# Patient Record
Sex: Female | Born: 1945 | Hispanic: No | Marital: Married | Smoking: Never smoker
Health system: Southern US, Community
[De-identification: ages and names within clinical notes are randomized; demographics above are authoritative.]

## PROBLEM LIST (undated history)

## (undated) DIAGNOSIS — R591 Generalized enlarged lymph nodes: Secondary | ICD-10-CM

## (undated) DIAGNOSIS — R3129 Other microscopic hematuria: Secondary | ICD-10-CM

## (undated) DIAGNOSIS — C3490 Malignant neoplasm of unspecified part of unspecified bronchus or lung: Secondary | ICD-10-CM

## (undated) DIAGNOSIS — Z860100 Personal history of colon polyps, unspecified: Secondary | ICD-10-CM

## (undated) DIAGNOSIS — B019 Varicella without complication: Secondary | ICD-10-CM

## (undated) DIAGNOSIS — R7303 Prediabetes: Secondary | ICD-10-CM

## (undated) DIAGNOSIS — E039 Hypothyroidism, unspecified: Secondary | ICD-10-CM

## (undated) DIAGNOSIS — I10 Essential (primary) hypertension: Secondary | ICD-10-CM

## (undated) DIAGNOSIS — C50919 Malignant neoplasm of unspecified site of unspecified female breast: Secondary | ICD-10-CM

## (undated) DIAGNOSIS — B0223 Postherpetic polyneuropathy: Secondary | ICD-10-CM

## (undated) DIAGNOSIS — K219 Gastro-esophageal reflux disease without esophagitis: Secondary | ICD-10-CM

## (undated) DIAGNOSIS — Z87442 Personal history of urinary calculi: Secondary | ICD-10-CM

## (undated) DIAGNOSIS — M1990 Unspecified osteoarthritis, unspecified site: Secondary | ICD-10-CM

## (undated) DIAGNOSIS — E7800 Pure hypercholesterolemia, unspecified: Secondary | ICD-10-CM

## (undated) DIAGNOSIS — E119 Type 2 diabetes mellitus without complications: Secondary | ICD-10-CM

## (undated) DIAGNOSIS — Z87898 Personal history of other specified conditions: Secondary | ICD-10-CM

## (undated) DIAGNOSIS — D179 Benign lipomatous neoplasm, unspecified: Secondary | ICD-10-CM

## (undated) DIAGNOSIS — G4730 Sleep apnea, unspecified: Secondary | ICD-10-CM

## (undated) DIAGNOSIS — E079 Disorder of thyroid, unspecified: Secondary | ICD-10-CM

## (undated) DIAGNOSIS — C801 Malignant (primary) neoplasm, unspecified: Secondary | ICD-10-CM

## (undated) HISTORY — PX: COLONOSCOPY: SHX174

## (undated) HISTORY — PX: FRACTURE SURGERY: SHX138

## (undated) HISTORY — PX: EYE SURGERY: SHX253

## (undated) HISTORY — PX: THORACOSCOPY: SUR1347

## (undated) HISTORY — PX: CLOSED REDUCTION ANKLE FRACTURE: SUR210

## (undated) HISTORY — PX: LYMPHADENECTOMY: SHX15

## (undated) HISTORY — PX: OTHER SURGICAL HISTORY: SHX169

## (undated) HISTORY — PX: MASTECTOMY: SHX3

## (undated) HISTORY — PX: ESOPHAGOGASTRODUODENOSCOPY: SHX1529

## (undated) HISTORY — PX: TONSILLECTOMY: SUR1361

## (undated) HISTORY — PX: BUNIONECTOMY: SHX129

## (undated) HISTORY — DX: Malignant neoplasm of unspecified site of unspecified female breast: C50.919

## (undated) HISTORY — PX: ABDOMINAL HYSTERECTOMY: SHX81

---

## 1992-12-16 DIAGNOSIS — C801 Malignant (primary) neoplasm, unspecified: Secondary | ICD-10-CM

## 1992-12-16 HISTORY — DX: Malignant (primary) neoplasm, unspecified: C80.1

## 2001-04-02 ENCOUNTER — Other Ambulatory Visit: Admission: RE | Admit: 2001-04-02 | Discharge: 2001-04-02 | Payer: Self-pay | Admitting: Otolaryngology

## 2004-10-15 ENCOUNTER — Ambulatory Visit: Payer: Self-pay | Admitting: Gastroenterology

## 2005-02-12 ENCOUNTER — Emergency Department: Payer: Self-pay | Admitting: General Practice

## 2006-03-05 ENCOUNTER — Emergency Department: Payer: Self-pay | Admitting: Emergency Medicine

## 2007-07-22 ENCOUNTER — Ambulatory Visit: Payer: Self-pay | Admitting: Gastroenterology

## 2008-05-23 ENCOUNTER — Ambulatory Visit: Payer: Self-pay

## 2009-07-17 ENCOUNTER — Ambulatory Visit: Payer: Self-pay | Admitting: Internal Medicine

## 2009-11-21 ENCOUNTER — Ambulatory Visit: Payer: Self-pay | Admitting: Gastroenterology

## 2012-03-01 ENCOUNTER — Ambulatory Visit: Payer: Self-pay | Admitting: Internal Medicine

## 2012-09-02 ENCOUNTER — Ambulatory Visit: Payer: Self-pay | Admitting: Surgery

## 2012-09-02 LAB — CREATININE, SERUM: EGFR (African American): >60

## 2013-02-01 ENCOUNTER — Emergency Department: Payer: Self-pay | Admitting: Emergency Medicine

## 2013-02-16 ENCOUNTER — Ambulatory Visit: Payer: Self-pay | Admitting: Gastroenterology

## 2013-02-17 LAB — PATHOLOGY REPORT

## 2013-07-21 ENCOUNTER — Ambulatory Visit: Payer: Self-pay | Admitting: Family Medicine

## 2014-02-16 HISTORY — PX: BRONCHOSCOPY: SUR163

## 2014-05-11 DIAGNOSIS — C3492 Malignant neoplasm of unspecified part of left bronchus or lung: Secondary | ICD-10-CM | POA: Insufficient documentation

## 2014-05-11 DIAGNOSIS — I10 Essential (primary) hypertension: Secondary | ICD-10-CM | POA: Insufficient documentation

## 2014-05-11 DIAGNOSIS — E039 Hypothyroidism, unspecified: Secondary | ICD-10-CM | POA: Insufficient documentation

## 2014-05-11 DIAGNOSIS — K219 Gastro-esophageal reflux disease without esophagitis: Secondary | ICD-10-CM | POA: Insufficient documentation

## 2015-05-15 DIAGNOSIS — E7800 Pure hypercholesterolemia, unspecified: Secondary | ICD-10-CM | POA: Insufficient documentation

## 2015-06-21 ENCOUNTER — Ambulatory Visit: Payer: Medicaid Other | Attending: Neurology

## 2015-07-19 ENCOUNTER — Ambulatory Visit: Payer: Medicaid Other | Attending: Neurology

## 2015-07-19 DIAGNOSIS — G4733 Obstructive sleep apnea (adult) (pediatric): Secondary | ICD-10-CM | POA: Diagnosis not present

## 2015-07-19 DIAGNOSIS — R0683 Snoring: Secondary | ICD-10-CM | POA: Diagnosis present

## 2015-08-09 ENCOUNTER — Ambulatory Visit: Payer: Medicaid Other | Attending: Neurology

## 2015-08-09 DIAGNOSIS — I10 Essential (primary) hypertension: Secondary | ICD-10-CM | POA: Diagnosis present

## 2015-08-09 DIAGNOSIS — R0683 Snoring: Secondary | ICD-10-CM | POA: Diagnosis present

## 2015-08-09 DIAGNOSIS — G4733 Obstructive sleep apnea (adult) (pediatric): Secondary | ICD-10-CM | POA: Diagnosis not present

## 2015-08-09 DIAGNOSIS — G4761 Periodic limb movement disorder: Secondary | ICD-10-CM | POA: Insufficient documentation

## 2015-12-06 DIAGNOSIS — G4733 Obstructive sleep apnea (adult) (pediatric): Secondary | ICD-10-CM | POA: Insufficient documentation

## 2017-09-23 ENCOUNTER — Other Ambulatory Visit: Payer: Self-pay | Admitting: Student

## 2017-09-23 DIAGNOSIS — R1011 Right upper quadrant pain: Secondary | ICD-10-CM

## 2017-09-25 ENCOUNTER — Ambulatory Visit
Admission: RE | Admit: 2017-09-25 | Discharge: 2017-09-25 | Disposition: A | Discharge status: Home / Self Care | Payer: Medicare HMO | Source: Ambulatory Visit | Attending: Student | Admitting: Student

## 2017-09-25 DIAGNOSIS — K760 Fatty (change of) liver, not elsewhere classified: Secondary | ICD-10-CM | POA: Insufficient documentation

## 2017-09-25 DIAGNOSIS — R1011 Right upper quadrant pain: Secondary | ICD-10-CM

## 2017-09-27 ENCOUNTER — Other Ambulatory Visit: Payer: Self-pay

## 2017-09-27 ENCOUNTER — Emergency Department
Admission: EM | Admit: 2017-09-27 | Discharge: 2017-09-27 | Disposition: A | Discharge status: Home / Self Care | Payer: Medicare HMO | Attending: Emergency Medicine | Admitting: Emergency Medicine

## 2017-09-27 DIAGNOSIS — M792 Neuralgia and neuritis, unspecified: Secondary | ICD-10-CM | POA: Diagnosis not present

## 2017-09-27 DIAGNOSIS — Z91040 Latex allergy status: Secondary | ICD-10-CM | POA: Insufficient documentation

## 2017-09-27 DIAGNOSIS — R1013 Epigastric pain: Secondary | ICD-10-CM | POA: Diagnosis present

## 2017-09-27 DIAGNOSIS — Z853 Personal history of malignant neoplasm of breast: Secondary | ICD-10-CM | POA: Diagnosis not present

## 2017-09-27 DIAGNOSIS — B029 Zoster without complications: Secondary | ICD-10-CM | POA: Insufficient documentation

## 2017-09-27 DIAGNOSIS — K219 Gastro-esophageal reflux disease without esophagitis: Secondary | ICD-10-CM | POA: Insufficient documentation

## 2017-09-27 DIAGNOSIS — Z85118 Personal history of other malignant neoplasm of bronchus and lung: Secondary | ICD-10-CM | POA: Insufficient documentation

## 2017-09-27 DIAGNOSIS — I10 Essential (primary) hypertension: Secondary | ICD-10-CM | POA: Diagnosis not present

## 2017-09-27 LAB — COMPREHENSIVE METABOLIC PANEL
ALBUMIN: 4 g/dL (ref 3.5–5.0)
ALK PHOS: 64 U/L (ref 38–126)
ALT: 23 U/L (ref 14–54)
AST: 25 U/L (ref 15–41)
Anion gap: 10 (ref 5–15)
BUN: 16 mg/dL (ref 6–20)
CALCIUM: 9 mg/dL (ref 8.9–10.3)
CHLORIDE: 106 mmol/L (ref 101–111)
CO2: 24 mmol/L (ref 22–32)
CREATININE: 0.75 mg/dL (ref 0.44–1.00)
GFR calc Af Amer: >60 mL/min (ref >60)
GFR calc non Af Amer: >60 mL/min (ref >60)
GLUCOSE: 118 mg/dL — AB (ref 65–99)
Potassium: 3.3 mmol/L — ABNORMAL LOW (ref 3.5–5.1)
Sodium: 140 mmol/L (ref 135–145)
Total Bilirubin: 0.6 mg/dL (ref 0.3–1.2)
Total Protein: 6.9 g/dL (ref 6.5–8.1)

## 2017-09-27 LAB — TROPONIN I

## 2017-09-27 LAB — CBC
HCT: 40 % (ref 35.0–47.0)
Hemoglobin: 13.6 g/dL (ref 12.0–16.0)
MCH: 30.1 pg (ref 26.0–34.0)
MCHC: 33.9 g/dL (ref 32.0–36.0)
MCV: 88.8 fL (ref 80.0–100.0)
PLATELETS: 249 10*3/uL (ref 150–440)
RBC: 4.51 MIL/uL (ref 3.80–5.20)
RDW: 13.5 % (ref 11.5–14.5)
WBC: 4.5 10*3/uL (ref 3.6–11.0)

## 2017-09-27 LAB — LIPASE, BLOOD: LIPASE: 36 U/L (ref 11–51)

## 2017-09-27 MED ORDER — VALACYCLOVIR HCL 500 MG PO TABS
1000.0000 mg | ORAL_TABLET | Freq: Once | ORAL | Status: AC
  Administered 2017-09-27: 1000 mg via ORAL
  Filled 2017-09-27: qty 2

## 2017-09-27 MED ORDER — FAMOTIDINE 20 MG PO TABS: 20.0000 mg | ORAL_TABLET | Freq: Two times a day (BID) | ORAL | 0 refills | Status: DC

## 2017-09-27 MED ORDER — VALACYCLOVIR HCL 1 G PO TABS: 1000.0000 mg | ORAL_TABLET | Freq: Three times a day (TID) | ORAL | 0 refills | Status: AC

## 2017-09-27 MED ORDER — GABAPENTIN 300 MG PO CAPS
300.0000 mg | ORAL_CAPSULE | Freq: Once | ORAL | Status: AC
  Administered 2017-09-27: 300 mg via ORAL
  Filled 2017-09-27: qty 1

## 2017-09-27 MED ORDER — LIDOCAINE HCL 2 % EX GEL
1.0000 "application " | Freq: Once | CUTANEOUS | Status: AC
  Administered 2017-09-27: 07:00:00 via TOPICAL
  Filled 2017-09-27: qty 10

## 2017-09-27 MED ORDER — GABAPENTIN 300 MG PO CAPS: 300.0000 mg | ORAL_CAPSULE | Freq: Three times a day (TID) | ORAL | 0 refills | Status: DC | PRN

## 2017-09-27 NOTE — Discharge Instructions (Signed)
1.  Take Valtrex 1 g 3 times daily for 7 days. 2.  You may take Neurontin 3 times daily as needed for pain. 3.  You may apply Lidocaine gel to affected area twice daily as needed for pain. 4.  Start Pepcid 20 mg twice daily. 5.  Return to the ER for worsening symptoms, persistent vomiting, difficulty breathing or other concerns.

## 2017-09-27 NOTE — ED Triage Notes (Signed)
Patient with complaint of epigastric pain radiating under right breast times 5 days. Patient states that she had an Ultra Sound Friday that she was told did not show anything. Patient denies nausea or vomiting.

## 2017-09-27 NOTE — ED Notes (Addendum)
Pt reports intermittent epigastric pain that comes and goes from a "5 to an 8 or 9" in intensity x6 days. Pt also states the pain sometimes radiates into the middle right side of her back. Pt denies N/V/D, no SHOB, no fever, no c/o of lightheadedness or dizziness.

## 2017-09-27 NOTE — ED Provider Notes (Signed)
[facility] Emergency Department Provider Note   ____________________________________________   First MD Initiated Contact with Patient 09/27/17 [phone]     (approximate)  I have reviewed the triage vital signs and the nursing notes.   HISTORY  Chief Complaint Abdominal Pain    HPI Betty Monroe is a 72 y.o. female who presents to the ED from home with a chief complaint of epigastric and right upper quadrant pain.  Patient developed the pain 5 days ago after attending a Super Bowl party during which she ate chicken wings which she does not usually do.  She was seen by her doctor's PA several days ago with normal lab work including LFTs and lipase.  The following day she had a right upper quadrant ultrasound which was normal.  Describes pain is sharp and burning from her epigastrium along the bottom of her right breast into her right flank.  Denies associated fever, chills, chest pain, shortness of breath, nausea, vomiting, dysuria.  Took MiraLAX yesterday and had a bowel movement.  Has been eating bland foods since pain began.  Eating does not make the pain better or worse.   Past medical history . Arthritis  . Breast cancer , unspecified (CMS-HCC) 12/1992  LEFT BREAST-CHEMO/RADIATION 20 YEARS AGO  . Chickenpox  . GERD (gastroesophageal reflux disease)  . H/O angioedema  History of ACE induced angioedema.  . History of colonic polyps  Adenomatous.  Marland Kitchen History of kidney stones  . Hypertension  . Lung cancer (CMS-HCC) 2005, 2015  Wedge resection 2005, then recurernce 2015 with completion LLLobectomy. Follows with Dr. Laveda Norman for Oncology  . Lymphadenopathy  . Microscopic hematuria  With negative workup by Dr. Jacqlyn Larsen.  . Multiple lipomas  . Sleep apnea  DENTAL APPLIANCE ONLY  . Thyroid disease   There are no active problems to display for this patient.   Past surgical history . AXILLARY LYMPHADENECTOMY  . BRONCHOSCOPY N/A 02/16/2014  Procedure:  BRONCHOSCOPY; Surgeon: Marcelline Deist, MD; Location: [place]; Service: Cardiothoracic; Laterality: N/A;  . Bunionectomy x2  Bunionectomy in 1986 and 1987.  Marland Kitchen CLOSED REDUCTION ANKLE FRACTURE  . COLONOSCOPY  . Deviated septum repair 2005  . EGD  . MASTECTOMY PARTIAL / LUMPECTOMY Left  . THORACOSCOPY WITH LOBECTOMY LUNG Left 02/16/2014  Procedure: THORACOSCOPY W/LOBECTOMY LUNG; Surgeon: Marcelline Deist, MD; Location: [place]; Service: Cardiothoracic; Laterality: Left;  . TONSILLECTOMY  T&A done at time of uvuloectomy.  Marland Kitchen uvular extraction    Prior to Admission medications   Not on File    Allergies Lisinopril and Latex  No family history on file.  Social History Social History   Tobacco Use  . Smoking status: Not on file  Substance Use Topics  . Alcohol use: Not on file  . Drug use: Not on file  Nonsmoker  Review of Systems  Constitutional: No fever/chills. Eyes: No visual changes. ENT: No sore throat. Cardiovascular: Denies chest pain. Respiratory: Denies shortness of breath. Gastrointestinal: Positive for upper abdominal pain.  No nausea, no vomiting.  No diarrhea.  No constipation. Genitourinary: Negative for dysuria. Musculoskeletal: Negative for back pain. Skin: Negative for rash. Neurological: Negative for headaches, focal weakness or numbness.   ____________________________________________   PHYSICAL EXAM:  VITAL SIGNS: ED Triage Vitals  Enc Vitals Group     BP 09/27/17 0517 (!) 166/85     Pulse Rate 09/27/17 0517 74     Resp 09/27/17 0517 20     Temp 09/27/17 0517 97.8 F (36.6  C)     Temp Source 09/27/17 0517 Oral     SpO2 09/27/17 0517 99 %     Weight 09/27/17 0514 200 lb (90.7 kg)     Height 09/27/17 0514 5\' 6"  (1.676 m)     Head Circumference --      Peak Flow --      Pain Score 09/27/17 0513 6     Pain Loc --      Pain Edu? --      Excl. in [place]? --     Constitutional: Alert and oriented. Well appearing and in no acute  distress. Eyes: Conjunctivae are normal. PERRL. EOMI. Head: Atraumatic. Nose: No congestion/rhinnorhea. Mouth/Throat: Mucous membranes are moist.  Oropharynx non-erythematous. Neck: No stridor.   Cardiovascular: Normal rate, regular rhythm. Grossly normal heart sounds.  Good peripheral circulation. Respiratory: Normal respiratory effort.  No retractions. Lungs CTAB. Gastrointestinal: Soft and nontender to light or deep palpation. No distention. No abdominal bruits. No CVA tenderness. Musculoskeletal: No lower extremity tenderness nor edema.  No joint effusions. Neurologic:  Normal speech and language. No gross focal neurologic deficits are appreciated. No gait instability. Skin:  Skin is warm, dry and intact.  Patchy and erythematous vesicular rash noted under right breast wrapping around lateral chest wall and back consistent with shingles.   Psychiatric: Mood and affect are normal. Speech and behavior are normal.  ____________________________________________   LABS (all labs ordered are listed, but only abnormal results are displayed)  Labs Reviewed  CBC  LIPASE, BLOOD  COMPREHENSIVE METABOLIC PANEL  URINALYSIS, COMPLETE (UACMP) WITH MICROSCOPIC  TROPONIN I   ____________________________________________  EKG  ED ECG REPORT I, Cally Nygard J, the attending physician, personally viewed and interpreted this ECG.   Date: 09/27/2017  EKG Time: 0521  Rate: 69  Rhythm: normal EKG, normal sinus rhythm  Axis: Normal  Intervals:none  ST&T Change: Nonspecific  ____________________________________________  RADIOLOGY  ED MD interpretation:  None  Official radiology report(s): No results found.  ____________________________________________   PROCEDURES  Procedure(s) performed: None  Procedures  Critical Care performed: No  ____________________________________________   INITIAL IMPRESSION / ASSESSMENT AND PLAN / ED COURSE  As part of my medical decision making, I  reviewed the following data within the [place] notes reviewed and incorporated, Labs reviewed, EKG interpreted, Old chart reviewed and Notes from prior ED visits.   72 year old female who presents with a 5-day history of right upper quadrant abdominal pain; normal lab work and ultrasound by her PCP earlier this week. Differential diagnosis includes, but is not limited to, biliary disease (biliary colic, acute cholecystitis, cholangitis, choledocholithiasis, etc), intrathoracic causes for epigastric abdominal pain including ACS, gastritis, duodenitis, pancreatitis, small bowel or large bowel obstruction, abdominal aortic aneurysm, hernia, and gastritis.  Lab work within normal limits.  Vesicular rash consistent with shingles noted on exam.  Patient states this was not there when her PCP examined her a few days ago.  I was able to see this note in Epic and there is no description of rash.  Likely patient presented with pain proceeding rash.  Clinically she does also sound like she has an element of GERD/PUD.  She has a stopped taking reflux medicine 2 years ago.  Will start her on Pepcid and she will follow-up with her PCP next week.  We will treat shingles with Valtrex, Neurontin and topical Lidocaine.  Patient recently had a course of steroids and antibiotics for bronchitis 2 weeks ago.  I am reluctant to give  another course of steroids in this elderly woman.  Strict return precautions given.  Patient verbalizes understanding and agrees with plan of care.      ____________________________________________   FINAL CLINICAL IMPRESSION(S) / ED DIAGNOSES  Final diagnoses:  Herpes zoster without complication  Neuropathic pain  Gastroesophageal reflux disease, esophagitis presence not specified     ED Discharge Orders    None       Note:  This document was prepared using Dragon voice recognition software and may include unintentional dictation errors.    Paulette Blanch, MD 09/27/17 [phone]

## 2018-06-22 DIAGNOSIS — Z85118 Personal history of other malignant neoplasm of bronchus and lung: Secondary | ICD-10-CM | POA: Insufficient documentation

## 2018-08-24 ENCOUNTER — Encounter: Payer: Self-pay | Admitting: *Deleted

## 2018-08-25 ENCOUNTER — Ambulatory Visit: Payer: Medicare HMO | Admitting: Certified Registered"

## 2018-08-25 ENCOUNTER — Other Ambulatory Visit: Payer: Self-pay

## 2018-08-25 ENCOUNTER — Ambulatory Visit
Admission: RE | Admit: 2018-08-25 | Discharge: 2018-08-25 | Disposition: A | Discharge status: Home / Self Care | Payer: Medicare HMO | Attending: Internal Medicine | Admitting: Internal Medicine

## 2018-08-25 ENCOUNTER — Encounter
Admission: RE | Disposition: A | Discharge status: Home / Self Care | Payer: Self-pay | Source: Home / Self Care | Attending: Internal Medicine

## 2018-08-25 ENCOUNTER — Encounter: Payer: Self-pay | Admitting: *Deleted

## 2018-08-25 DIAGNOSIS — Z853 Personal history of malignant neoplasm of breast: Secondary | ICD-10-CM | POA: Diagnosis not present

## 2018-08-25 DIAGNOSIS — G4733 Obstructive sleep apnea (adult) (pediatric): Secondary | ICD-10-CM | POA: Insufficient documentation

## 2018-08-25 DIAGNOSIS — K449 Diaphragmatic hernia without obstruction or gangrene: Secondary | ICD-10-CM | POA: Insufficient documentation

## 2018-08-25 DIAGNOSIS — Z86010 Personal history of colonic polyps: Secondary | ICD-10-CM | POA: Diagnosis not present

## 2018-08-25 DIAGNOSIS — Z79890 Hormone replacement therapy (postmenopausal): Secondary | ICD-10-CM | POA: Insufficient documentation

## 2018-08-25 DIAGNOSIS — Z79899 Other long term (current) drug therapy: Secondary | ICD-10-CM | POA: Insufficient documentation

## 2018-08-25 DIAGNOSIS — Z85118 Personal history of other malignant neoplasm of bronchus and lung: Secondary | ICD-10-CM | POA: Diagnosis not present

## 2018-08-25 DIAGNOSIS — Z1211 Encounter for screening for malignant neoplasm of colon: Secondary | ICD-10-CM | POA: Diagnosis not present

## 2018-08-25 DIAGNOSIS — K210 Gastro-esophageal reflux disease with esophagitis: Secondary | ICD-10-CM | POA: Insufficient documentation

## 2018-08-25 DIAGNOSIS — K5730 Diverticulosis of large intestine without perforation or abscess without bleeding: Secondary | ICD-10-CM | POA: Insufficient documentation

## 2018-08-25 DIAGNOSIS — K640 First degree hemorrhoids: Secondary | ICD-10-CM | POA: Insufficient documentation

## 2018-08-25 DIAGNOSIS — I10 Essential (primary) hypertension: Secondary | ICD-10-CM | POA: Diagnosis not present

## 2018-08-25 DIAGNOSIS — K635 Polyp of colon: Secondary | ICD-10-CM | POA: Insufficient documentation

## 2018-08-25 DIAGNOSIS — E079 Disorder of thyroid, unspecified: Secondary | ICD-10-CM | POA: Diagnosis not present

## 2018-08-25 DIAGNOSIS — K2970 Gastritis, unspecified, without bleeding: Secondary | ICD-10-CM | POA: Insufficient documentation

## 2018-08-25 HISTORY — DX: Personal history of colonic polyps: Z86.010

## 2018-08-25 HISTORY — DX: Disorder of thyroid, unspecified: E07.9

## 2018-08-25 HISTORY — DX: Malignant neoplasm of unspecified part of unspecified bronchus or lung: C34.90

## 2018-08-25 HISTORY — DX: Other microscopic hematuria: R31.29

## 2018-08-25 HISTORY — DX: Postherpetic polyneuropathy: B02.23

## 2018-08-25 HISTORY — DX: Gastro-esophageal reflux disease without esophagitis: K21.9

## 2018-08-25 HISTORY — PX: ESOPHAGOGASTRODUODENOSCOPY (EGD) WITH PROPOFOL: SHX5813

## 2018-08-25 HISTORY — DX: Sleep apnea, unspecified: G47.30

## 2018-08-25 HISTORY — DX: Personal history of other specified conditions: Z87.898

## 2018-08-25 HISTORY — DX: Unspecified osteoarthritis, unspecified site: M19.90

## 2018-08-25 HISTORY — DX: Essential (primary) hypertension: I10

## 2018-08-25 HISTORY — DX: Personal history of urinary calculi: Z87.442

## 2018-08-25 HISTORY — DX: Personal history of colon polyps, unspecified: Z86.0100

## 2018-08-25 HISTORY — DX: Benign lipomatous neoplasm, unspecified: D17.9

## 2018-08-25 HISTORY — PX: COLONOSCOPY WITH PROPOFOL: SHX5780

## 2018-08-25 HISTORY — DX: Malignant (primary) neoplasm, unspecified: C80.1

## 2018-08-25 HISTORY — DX: Varicella without complication: B01.9

## 2018-08-25 HISTORY — DX: Generalized enlarged lymph nodes: R59.1

## 2018-08-25 SURGERY — COLONOSCOPY WITH PROPOFOL
Anesthesia: General

## 2018-08-25 MED ORDER — PROPOFOL 500 MG/50ML IV EMUL
INTRAVENOUS | Status: DC | PRN
  Administered 2018-08-25: 140 ug/kg/min via INTRAVENOUS

## 2018-08-25 MED ORDER — SODIUM CHLORIDE 0.9 % IV SOLN
INTRAVENOUS | Status: DC
  Administered 2018-08-25: 12:00:00 via INTRAVENOUS

## 2018-08-25 MED ORDER — PROPOFOL 10 MG/ML IV BOLUS
INTRAVENOUS | Status: DC | PRN
  Administered 2018-08-25: 50 mg via INTRAVENOUS
  Administered 2018-08-25 (×2): 20 mg via INTRAVENOUS

## 2018-08-25 NOTE — Anesthesia Post-op Follow-up Note (Signed)
Anesthesia QCDR form completed.        

## 2018-08-25 NOTE — H&P (Signed)
Outpatient short stay form Pre-procedure 08/25/2018 12:23 PM Anatalia Kronk K. Alice Reichert, M.D.  Primary Physician: Derinda Late, M.D.  Reason for visit:  Esophagitis, GERD,   History of present illness:  Betty Monroe is a 73 y/o female presenting for hx of esophagitis, GERD and personal hx of colonic polyps. Patient denies rectal bleeding, weight loss or lack of appetite. Last colonoscopy in 2014 showed no polyps despite she had polyps on her initial colonoscopy. Patient has OSA on CPAP.     Current Facility-Administered Medications:  .  0.9 %  sodium chloride infusion, , Intravenous, Continuous, [place], Benay Pike, MD, Last Rate: 20 mL/hr at 08/25/18 1211  Medications Prior to Admission  Medication Sig Dispense Refill Last Dose  . amLODipine (NORVASC) 5 MG tablet Take 5 mg by mouth daily.   08/25/2018 at 0730  . calcium carbonate (OS-CAL) 1250 (500 Ca) MG chewable tablet Chew 1 tablet by mouth daily.   Past Week at Unknown time  . DULoxetine (CYMBALTA) 60 MG capsule Take 60 mg by mouth daily.     . fluticasone (FLONASE) 50 MCG/ACT nasal spray Place into both nostrils daily.     . hydrochlorothiazide (HYDRODIURIL) 25 MG tablet Take 25 mg by mouth daily.   08/25/2018 at 0730  . levothyroxine (SYNTHROID) 112 MCG tablet Take 112 mcg by mouth daily before breakfast.     . vitamin E 400 UNIT capsule Take 400 Units by mouth daily.     . famotidine (PEPCID) 20 MG tablet Take 1 tablet (20 mg total) by mouth 2 (two) times daily. 60 tablet 0   . gabapentin (NEURONTIN) 300 MG capsule Take 1 capsule (300 mg total) by mouth 3 (three) times daily as needed for up to 7 days. 21 capsule 0      Allergies  Allergen Reactions  . Lisinopril Swelling  . Latex Rash    Adhesive tape- silicones     Past Medical History:  Diagnosis Date  . Arthritis   . Cancer [facility]) 12/1992   breast  . Chickenpox   . GERD (gastroesophageal reflux disease)   . History of angioedema   . History of colonic polyps    adenomatous   . History of kidney stones   . Hypertension   . Lung cancer ([place]) 2005, 2015  . Lymphadenopathy   . Microscopic hematuria   . Multiple lipomas   . Shingles (herpes zoster) polyneuropathy   . Sleep apnea   . Thyroid disease     Review of systems:  Otherwise negative.    Physical Exam  Gen: Alert, oriented. Appears stated age.  HEENT: /AT. PERRLA. Lungs: CTA, no wheezes. CV: RR nl S1, S2. Abd: soft, benign, no masses. BS+ Ext: No edema. Pulses 2+    Planned procedures: Proceed with EGD and colonoscopy. The patient understands the nature of the planned procedure, indications, risks, alternatives and potential complications including but not limited to bleeding, infection, perforation, damage to internal organs and possible oversedation/side effects from anesthesia. The patient agrees and gives consent to proceed.  Please refer to procedure notes for findings, recommendations and patient disposition/instructions.     Cristina Mattern K. Alice Reichert, M.D. Gastroenterology 08/25/2018  12:23 PM

## 2018-08-25 NOTE — Interval H&P Note (Signed)
History and Physical Interval Note:  08/25/2018 12:29 PM  Betty Monroe  has presented today for surgery, with the diagnosis of GERD HX OF POLYPS  The various methods of treatment have been discussed with the patient and family. After consideration of risks, benefits and other options for treatment, the patient has consented to  Procedure(s): COLONOSCOPY WITH PROPOFOL (N/A) ESOPHAGOGASTRODUODENOSCOPY (EGD) WITH PROPOFOL (N/A) as a surgical intervention .  The patient's history has been reviewed, patient examined, no change in status, stable for surgery.  I have reviewed the patient's chart and labs.  Questions were answered to the patient's satisfaction.     [place], [place]

## 2018-08-25 NOTE — Transfer of Care (Signed)
Immediate Anesthesia Transfer of Care Note  Patient: Betty Monroe  Procedure(s) Performed: COLONOSCOPY WITH PROPOFOL (N/A ) ESOPHAGOGASTRODUODENOSCOPY (EGD) WITH PROPOFOL (N/A )  Patient Location: PACU and Endoscopy Unit  Anesthesia Type:General  Level of Consciousness: awake, drowsy and patient cooperative  Airway & Oxygen Therapy: Patient Spontanous Breathing and Patient connected to nasal cannula oxygen  Post-op Assessment: Report given to RN and Post -op Vital signs reviewed and stable  Post vital signs: Reviewed and stable  Last Vitals:  Vitals Value Taken Time  BP 112/67 08/25/2018  1:19 PM  Temp 36.1 C 08/25/2018  1:17 PM  Pulse 62 08/25/2018  1:22 PM  Resp 18 08/25/2018  1:22 PM  SpO2 99 % 08/25/2018  1:22 PM  Vitals shown include unvalidated device data.  Last Pain:  Vitals:   08/25/18 1317  TempSrc: Tympanic  PainSc: Asleep         Complications: No apparent anesthesia complications

## 2018-08-25 NOTE — Anesthesia Preprocedure Evaluation (Signed)
Anesthesia Evaluation  Patient identified by MRN, date of birth, ID band Patient awake    Reviewed: Allergy & Precautions, NPO status , Patient's Chart, lab work & pertinent test results  History of Anesthesia Complications Negative for: history of anesthetic complications  Airway Mallampati: II  TM Distance: >3 FB Neck ROM: Full    Dental no notable dental hx.    Pulmonary sleep apnea and Continuous Positive Airway Pressure Ventilation , neg COPD,    breath sounds clear to auscultation- rhonchi (-) wheezing      Cardiovascular Exercise Tolerance: Good hypertension, Pt. on medications (-) CAD, (-) Past MI, (-) Cardiac Stents and (-) CABG  Rhythm:Regular Rate:Normal - Systolic murmurs and - Diastolic murmurs    Neuro/Psych neg Seizures negative neurological ROS  negative psych ROS   GI/Hepatic Neg liver ROS, GERD  ,  Endo/Other  negative endocrine ROSneg diabetes  Renal/GU negative Renal ROS     Musculoskeletal  (+) Arthritis ,   Abdominal (+) + obese,   Peds  Hematology negative hematology ROS (+)   Anesthesia Other Findings Past Medical History: No date: Arthritis 12/1992: Cancer (HCC)     Comment:  breast No date: Chickenpox No date: GERD (gastroesophageal reflux disease) No date: History of angioedema No date: History of colonic polyps     Comment:  adenomatous No date: History of kidney stones No date: Hypertension 2005, 2015: Lung cancer ([place]) No date: Lymphadenopathy No date: Microscopic hematuria No date: Multiple lipomas No date: Shingles (herpes zoster) polyneuropathy No date: Sleep apnea No date: Thyroid disease   Reproductive/Obstetrics                             Anesthesia Physical Anesthesia Plan  ASA: II  Anesthesia Plan: General   Post-op Pain Management:    Induction: Intravenous  PONV Risk Score and Plan: 2 and Propofol infusion  Airway Management  Planned: Natural Airway  Additional Equipment:   Intra-op Plan:   Post-operative Plan:   Informed Consent: I have reviewed the patients History and Physical, chart, labs and discussed the procedure including the risks, benefits and alternatives for the proposed anesthesia with the patient or authorized representative who has indicated his/her understanding and acceptance.   Dental advisory given  Plan Discussed with: CRNA and Anesthesiologist  Anesthesia Plan Comments:         Anesthesia Quick Evaluation

## 2018-08-25 NOTE — Anesthesia Postprocedure Evaluation (Signed)
Anesthesia Post Note  Patient: Leahmarie Gasiorowski  Procedure(s) Performed: COLONOSCOPY WITH PROPOFOL (N/A ) ESOPHAGOGASTRODUODENOSCOPY (EGD) WITH PROPOFOL (N/A )  Patient location during evaluation: Endoscopy Anesthesia Type: General Level of consciousness: awake and alert and oriented Pain management: pain level controlled Vital Signs Assessment: post-procedure vital signs reviewed and stable Respiratory status: spontaneous breathing, nonlabored ventilation and respiratory function stable Cardiovascular status: blood pressure returned to baseline and stable Postop Assessment: no signs of nausea or vomiting Anesthetic complications: no     Last Vitals:  Vitals:   08/25/18 1327 08/25/18 1337  BP: 124/65 (!) 146/68  Pulse:    Resp:    Temp:    SpO2:      Last Pain:  Vitals:   08/25/18 1347  TempSrc:   PainSc: 0-No pain                 Elowen Debruyn

## 2018-08-25 NOTE — Op Note (Signed)
[facility] Gastroenterology Patient Name: Betty Monroe Procedure Date: 08/25/2018 11:53 AM MRN: [id number] Account #: [id number] Date of Birth: [birth year] Admit Type: Outpatient Age: 73 Room: [facility] ENDO ROOM 3 Gender: Female Note Status: Finalized Procedure:            Colonoscopy Indications:          High risk colon cancer surveillance: Personal history                        of colonic polyps Providers:            Benay Pike. Trinisha Paget MD, MD Medicines:            Propofol per Anesthesia Complications:        No immediate complications. Procedure:            Pre-Anesthesia Assessment:                       - The risks and benefits of the procedure and the                        sedation options and risks were discussed with the                        patient. All questions were answered and informed                        consent was obtained.                       - Patient identification and proposed procedure were                        verified prior to the procedure by the nurse. The                        procedure was verified in the procedure room.                       - ASA Grade Assessment: III - A patient with severe                        systemic disease.                       - After reviewing the risks and benefits, the patient                        was deemed in satisfactory condition to undergo the                        procedure.                       After obtaining informed consent, the colonoscope was                        passed under direct vision. Throughout the procedure,                        the patient's blood pressure, pulse, and oxygen  saturations were monitored continuously. The                        Colonoscope was introduced through the anus and                        advanced to the the cecum, identified by appendiceal                        orifice and ileocecal valve. The colonoscopy was                         performed without difficulty. The patient tolerated the                        procedure well. The quality of the bowel preparation                        was excellent. The ileocecal valve, appendiceal                        orifice, and rectum were photographed. Findings:      The perianal and digital rectal examinations were normal. Pertinent       negatives include normal sphincter tone and no palpable rectal lesions.      Many small-mouthed diverticula were found in the sigmoid colon. There       was no evidence of diverticular bleeding.      Three sessile polyps were found in the descending colon. The polyps were       2 to 3 mm in size. These polyps were removed with a jumbo cold forceps.       Resection and retrieval were complete.      A 5 mm polyp was found in the distal sigmoid colon. The polyp was       pedunculated. The polyp was removed with a cold snare. Resection and       retrieval were complete.      Non-bleeding internal hemorrhoids were found during retroflexion. The       hemorrhoids were Grade I (internal hemorrhoids that do not prolapse).      The exam was otherwise without abnormality. Impression:           - Mild diverticulosis in the sigmoid colon. There was                        no evidence of diverticular bleeding.                       - Three 2 to 3 mm polyps in the descending colon,                        removed with a jumbo cold forceps. Resected and                        retrieved.                       - One 5 mm polyp in the distal sigmoid colon, removed                        with  a cold snare. Resected and retrieved.                       - Non-bleeding internal hemorrhoids.                       - The examination was otherwise normal. Recommendation:       - Patient has a contact number available for                        emergencies. The signs and symptoms of potential                        delayed complications were discussed with the  patient.                        Return to normal activities tomorrow. Written discharge                        instructions were provided to the patient.                       - Resume previous diet.                       - Continue present medications.                       - Repeat colonoscopy is recommended for surveillance.                        The colonoscopy date will be determined after pathology                        results from today's exam become available for review.                       - Return to GI office in 1 year.                       - The findings and recommendations were discussed with                        the patient and their spouse. Procedure Code(s):    --- Professional ---                       [phone], Colonoscopy, flexible; with removal of tumor(s),                        polyp(s), or other lesion(s) by snare technique                       68127, 38, Colonoscopy, flexible; with biopsy, single                        or multiple Diagnosis Code(s):    --- Professional ---                       K57.30, Diverticulosis of large intestine without  perforation or abscess without bleeding                       D12.5, Benign neoplasm of sigmoid colon                       D12.4, Benign neoplasm of descending colon                       K64.0, First degree hemorrhoids                       Z86.010, Personal history of colonic polyps CPT copyright 2018 [employer] Association. All rights reserved. The codes documented in this report are preliminary and upon coder review may  be revised to meet current compliance requirements. Efrain Sella MD, MD 08/25/2018 1:15:15 PM This report has been signed electronically. Number of Addenda: 0 Note Initiated On: 08/25/2018 11:53 AM Scope Withdrawal Time: 0 hours 8 minutes 18 seconds  Total Procedure Duration: 0 hours 12 minutes 9 seconds       [facility]

## 2018-08-25 NOTE — Op Note (Signed)
[facility] Gastroenterology Patient Name: Betty Monroe Procedure Date: 08/25/2018 11:53 AM MRN: [id number] Account #: [id number] Date of Birth: 03/31/1946 Admit Type: Outpatient Age: 73 Room: [facility] ENDO ROOM 3 Gender: Female Note Status: Finalized Procedure:            Upper GI endoscopy Indications:          Follow-up of reflux esophagitis Providers:            Lorie Apley K. Amybeth Sieg MD, MD Medicines:            Propofol per Anesthesia Complications:        No immediate complications. Procedure:            Pre-Anesthesia Assessment:                       - The risks and benefits of the procedure and the                        sedation options and risks were discussed with the                        patient. All questions were answered and informed                        consent was obtained.                       - Patient identification and proposed procedure were                        verified prior to the procedure by the nurse. The                        procedure was verified in the procedure room.                       - ASA Grade Assessment: III - A patient with severe                        systemic disease.                       - After reviewing the risks and benefits, the patient                        was deemed in satisfactory condition to undergo the                        procedure.                       After obtaining informed consent, the endoscope was                        passed under direct vision. Throughout the procedure,                        the patient's blood pressure, pulse, and oxygen                        saturations were monitored continuously. The Endoscope  was introduced through the mouth, and advanced to the                        third part of duodenum. The upper GI endoscopy was                        accomplished without difficulty. The patient tolerated                        the procedure  well. Findings:      LA Grade A (one or more mucosal breaks less than 5 mm, not extending       between tops of 2 mucosal folds) esophagitis with no bleeding was found       in the distal esophagus.      Localized minimal inflammation characterized by erythema was found in       the gastric antrum.      A 1 cm hiatal hernia was present.      The examined duodenum was normal.      The exam was otherwise without abnormality. Impression:           - LA Grade A reflux esophagitis.                       - Gastritis.                       - 1 cm hiatal hernia.                       - Normal examined duodenum.                       - The examination was otherwise normal.                       - No specimens collected. Recommendation:       - Proceed with colonoscopy Procedure Code(s):    --- Professional ---                       [phone], Esophagogastroduodenoscopy, flexible, transoral;                        diagnostic, including collection of specimen(s) by                        brushing or washing, when performed (separate procedure) Diagnosis Code(s):    --- Professional ---                       K44.9, Diaphragmatic hernia without obstruction or                        gangrene                       K29.70, Gastritis, unspecified, without bleeding                       K21.0, Gastro-esophageal reflux disease with esophagitis CPT copyright 2018 American Medical Association. All rights reserved. The codes documented in this report are preliminary and upon coder review may  be revised to meet current compliance requirements. Benay Pike  [facility] MD, MD 08/25/2018 12:58:27 PM This report has been signed electronically. Number of Addenda: 0 Note Initiated On: 08/25/2018 11:53 AM      [facility]

## 2018-08-26 ENCOUNTER — Encounter: Payer: Self-pay | Admitting: Internal Medicine

## 2018-08-26 LAB — SURGICAL PATHOLOGY

## 2018-09-20 ENCOUNTER — Other Ambulatory Visit: Payer: Self-pay | Admitting: Sports Medicine

## 2018-09-20 DIAGNOSIS — M25562 Pain in left knee: Principal | ICD-10-CM

## 2018-09-20 DIAGNOSIS — M76892 Other specified enthesopathies of left lower limb, excluding foot: Secondary | ICD-10-CM

## 2018-09-20 DIAGNOSIS — M1712 Unilateral primary osteoarthritis, left knee: Secondary | ICD-10-CM

## 2018-09-20 DIAGNOSIS — G8929 Other chronic pain: Secondary | ICD-10-CM

## 2018-09-30 ENCOUNTER — Ambulatory Visit
Admission: RE | Admit: 2018-09-30 | Discharge: 2018-09-30 | Disposition: A | Discharge status: Home / Self Care | Payer: Medicare HMO | Source: Ambulatory Visit | Attending: Sports Medicine | Admitting: Sports Medicine

## 2018-09-30 DIAGNOSIS — G8929 Other chronic pain: Secondary | ICD-10-CM | POA: Insufficient documentation

## 2018-09-30 DIAGNOSIS — M1712 Unilateral primary osteoarthritis, left knee: Secondary | ICD-10-CM | POA: Insufficient documentation

## 2018-09-30 DIAGNOSIS — M76892 Other specified enthesopathies of left lower limb, excluding foot: Secondary | ICD-10-CM | POA: Insufficient documentation

## 2018-09-30 DIAGNOSIS — M25562 Pain in left knee: Secondary | ICD-10-CM | POA: Insufficient documentation

## 2019-08-23 DIAGNOSIS — E119 Type 2 diabetes mellitus without complications: Secondary | ICD-10-CM | POA: Insufficient documentation

## 2019-11-23 ENCOUNTER — Encounter: Payer: Self-pay | Admitting: Podiatry

## 2019-11-23 ENCOUNTER — Other Ambulatory Visit: Payer: Self-pay

## 2019-11-23 ENCOUNTER — Ambulatory Visit (INDEPENDENT_AMBULATORY_CARE_PROVIDER_SITE_OTHER): Payer: Medicare HMO

## 2019-11-23 ENCOUNTER — Ambulatory Visit: Payer: Medicare HMO | Admitting: Podiatry

## 2019-11-23 VITALS — BP 148/82

## 2019-11-23 DIAGNOSIS — M722 Plantar fascial fibromatosis: Secondary | ICD-10-CM

## 2019-11-23 DIAGNOSIS — L603 Nail dystrophy: Secondary | ICD-10-CM

## 2019-11-23 NOTE — Patient Instructions (Signed)

## 2019-11-23 NOTE — Progress Notes (Signed)
Subjective:  Patient ID: Betty Monroe, female    DOB: 1945-11-25,  MRN: [id number] HPI Chief Complaint  Patient presents with  . Foot Pain    Plantar heel left - aching x 1 week, no AM pain, but she does have pain when she gets up in the middle of the night, tried stretching it-no help  . Diabetes    Last a1c was 7.0  . New Patient (Initial Visit)    74 y.o. female presents with the above complaint.   ROS: She denies fever chills nausea vomiting muscle aches pains calf pain back pain chest pain shortness of breath.  She did have an odd sensation of the right side numbness and tingling when she stood up from the waiting room.  Upon evaluation in the exam room she did not demonstrate any signs of stroke nor did she demonstrate any weakness and the right arm or in the right leg.  Past Medical History:  Diagnosis Date  . Arthritis   . Cancer [facility]) 12/1992   breast  . Chickenpox   . GERD (gastroesophageal reflux disease)   . History of angioedema   . History of colonic polyps    adenomatous  . History of kidney stones   . Hypertension   . Lung cancer ([place]) 2005, 2015  . Lymphadenopathy   . Microscopic hematuria   . Multiple lipomas   . Shingles (herpes zoster) polyneuropathy   . Sleep apnea   . Thyroid disease    Past Surgical History:  Procedure Laterality Date  . BRONCHOSCOPY  02/16/2014  . BUNIONECTOMY     x2  . COLONOSCOPY    . COLONOSCOPY WITH PROPOFOL N/A 08/25/2018   Procedure: COLONOSCOPY WITH PROPOFOL;  Surgeon: Toledo, Benay Pike, MD;  Location: [facility] ENDOSCOPY;  Service: Gastroenterology;  Laterality: N/A;  . ESOPHAGOGASTRODUODENOSCOPY    . ESOPHAGOGASTRODUODENOSCOPY (EGD) WITH PROPOFOL N/A 08/25/2018   Procedure: ESOPHAGOGASTRODUODENOSCOPY (EGD) WITH PROPOFOL;  Surgeon: Toledo, Benay Pike, MD;  Location: [facility] ENDOSCOPY;  Service: Gastroenterology;  Laterality: N/A;  . FRACTURE SURGERY     Closed reduction ankle fracture   . LYMPHADENECTOMY    . MASTECTOMY Left   .  THORACOSCOPY Left    with lobectomy lung   . TONSILLECTOMY    . uvular extraction      Current Outpatient Medications:  Marland Kitchen  VITAMIN D PO, Take by mouth., Disp: , Rfl:  .  levothyroxine (SYNTHROID) 112 MCG tablet, Take 112 mcg by mouth daily before breakfast., Disp: , Rfl:  .  losartan-hydrochlorothiazide (HYZAAR) 100-25 MG tablet, Take 1 tablet by mouth daily., Disp: , Rfl:  .  metFORMIN (GLUCOPHAGE-XR) 500 MG 24 hr tablet, Take 500 mg by mouth daily., Disp: , Rfl:  .  vitamin E 400 UNIT capsule, Take 400 Units by mouth daily., Disp: , Rfl:   Allergies  Allergen Reactions  . Lisinopril Swelling  . Latex Rash    Adhesive tape- silicones  . Other Rash   Review of Systems Objective:   Vitals:   11/23/19 1418  BP: (!) 148/82    General: Well developed, nourished, in no acute distress, alert and oriented x3   Dermatological: Skin is warm, dry and supple bilateral. Nails x 10 are well maintained; remaining integument appears unremarkable at this time. There are no open sores, no preulcerative lesions, no rash or signs of infection present.  She also has a thickening of the fibular border of the hallux nail right.  Possible nail dystrophy versus onychomycosis.  Vascular: Dorsalis  Pedis artery and Posterior Tibial artery pedal pulses are 2/4 bilateral with immedate capillary fill time. Pedal hair growth present. No varicosities and no lower extremity edema present bilateral.   Neruologic: Grossly intact via light touch bilateral. Vibratory intact via tuning fork bilateral. Protective threshold with Semmes Wienstein monofilament intact to all pedal sites bilateral. Patellar and Achilles deep tendon reflexes 2+ bilateral. No Babinski or clonus noted bilateral.   Musculoskeletal: No gross boney pedal deformities bilateral. No pain, crepitus, or limitation noted with foot and ankle range of motion bilateral. Muscular strength 5/5 in all groups tested bilateral.  She has pain on palpation  medial calcaneal tubercle of the right heel.  This is central calcaneal tubercle right side.  Gait: Unassisted, Nonantalgic.    Radiographs:  Radiographs demonstrate soft tissue increase in density plantar fashion calcaneal surgical site a small plantar distally oriented calcaneal heel spur is noted.  No other acute abnormalities.  No significant osteoarthritic changes.  Assessment & Plan:   Assessment: Nail dystrophy hallux right plantar fasciitis right  Plan: Discussed etiology pathology conservative surgical therapies at this point we will take samples of the nail hallux right to be sent for pathologic evaluation I am also going to inject her right heel with 20 mg Kenalog 5 mg Marcaine after sterile Betadine skin prep to the point of maximal tenderness.  And we will place her in a plantar fascial brace.  Instructed her on appropriate shoe gear stretching exercise ice therapy shoe gear modifications.  Follow-up with her in 1 month for pathology results as well as to reevaluate the plantar fasciitis.     Max T. [place], [place]

## 2019-11-25 ENCOUNTER — Ambulatory Visit
Admission: EM | Admit: 2019-11-25 | Discharge: 2019-11-25 | Disposition: A | Discharge status: Other Acute Inpatient Hospital | Payer: Medicare HMO

## 2019-11-25 ENCOUNTER — Emergency Department: Payer: Medicare HMO

## 2019-11-25 ENCOUNTER — Other Ambulatory Visit: Payer: Self-pay

## 2019-11-25 ENCOUNTER — Emergency Department
Admission: EM | Admit: 2019-11-25 | Discharge: 2019-11-25 | Disposition: A | Discharge status: Home / Self Care | Payer: Medicare HMO | Attending: Emergency Medicine | Admitting: Emergency Medicine

## 2019-11-25 ENCOUNTER — Encounter: Payer: Self-pay | Admitting: *Deleted

## 2019-11-25 DIAGNOSIS — R202 Paresthesia of skin: Secondary | ICD-10-CM

## 2019-11-25 DIAGNOSIS — R200 Anesthesia of skin: Secondary | ICD-10-CM | POA: Diagnosis not present

## 2019-11-25 DIAGNOSIS — I10 Essential (primary) hypertension: Secondary | ICD-10-CM | POA: Diagnosis not present

## 2019-11-25 DIAGNOSIS — Z79899 Other long term (current) drug therapy: Secondary | ICD-10-CM | POA: Insufficient documentation

## 2019-11-25 DIAGNOSIS — Z853 Personal history of malignant neoplasm of breast: Secondary | ICD-10-CM | POA: Insufficient documentation

## 2019-11-25 DIAGNOSIS — Z85118 Personal history of other malignant neoplasm of bronchus and lung: Secondary | ICD-10-CM | POA: Insufficient documentation

## 2019-11-25 DIAGNOSIS — R519 Headache, unspecified: Secondary | ICD-10-CM

## 2019-11-25 DIAGNOSIS — Z7984 Long term (current) use of oral hypoglycemic drugs: Secondary | ICD-10-CM | POA: Insufficient documentation

## 2019-11-25 DIAGNOSIS — E119 Type 2 diabetes mellitus without complications: Secondary | ICD-10-CM | POA: Diagnosis not present

## 2019-11-25 LAB — URINALYSIS, ROUTINE W REFLEX MICROSCOPIC
Bilirubin Urine: NEGATIVE
Glucose, UA: NEGATIVE mg/dL
Ketones, ur: NEGATIVE mg/dL
Leukocytes,Ua: NEGATIVE
Nitrite: NEGATIVE
Protein, ur: NEGATIVE mg/dL
Specific Gravity, Urine: 1.016 (ref 1.005–1.030)
pH: 5 (ref 5.0–8.0)

## 2019-11-25 LAB — COMPREHENSIVE METABOLIC PANEL
ALT: 29 U/L (ref 0–44)
AST: 24 U/L (ref 15–41)
Albumin: 4.4 g/dL (ref 3.5–5.0)
Alkaline Phosphatase: 62 U/L (ref 38–126)
Anion gap: 9 (ref 5–15)
BUN: 19 mg/dL (ref 8–23)
CO2: 27 mmol/L (ref 22–32)
Calcium: 9.3 mg/dL (ref 8.9–10.3)
Chloride: 104 mmol/L (ref 98–111)
Creatinine, Ser: 0.63 mg/dL (ref 0.44–1.00)
GFR calc Af Amer: >60 mL/min (ref >60)
GFR calc non Af Amer: >60 mL/min (ref >60)
Glucose, Bld: 107 mg/dL — ABNORMAL HIGH (ref 70–99)
Potassium: 3.6 mmol/L (ref 3.5–5.1)
Sodium: 140 mmol/L (ref 135–145)
Total Bilirubin: 0.7 mg/dL (ref 0.3–1.2)
Total Protein: 7.2 g/dL (ref 6.5–8.1)

## 2019-11-25 LAB — CBC WITH DIFFERENTIAL/PLATELET
Abs Immature Granulocytes: 0.03 10*3/uL (ref 0.00–0.07)
Basophils Absolute: 0 10*3/uL (ref 0.0–0.1)
Basophils Relative: 1 %
Eosinophils Absolute: 0.1 10*3/uL (ref 0.0–0.5)
Eosinophils Relative: 2 %
HCT: 41.6 % (ref 36.0–46.0)
Hemoglobin: 13.9 g/dL (ref 12.0–15.0)
Immature Granulocytes: 1 %
Lymphocytes Relative: 32 %
Lymphs Abs: 2.1 10*3/uL (ref 0.7–4.0)
MCH: 29.2 pg (ref 26.0–34.0)
MCHC: 33.4 g/dL (ref 30.0–36.0)
MCV: 87.4 fL (ref 80.0–100.0)
Monocytes Absolute: 0.6 10*3/uL (ref 0.1–1.0)
Monocytes Relative: 9 %
Neutro Abs: 3.7 10*3/uL (ref 1.7–7.7)
Neutrophils Relative %: 55 %
Platelets: 230 10*3/uL (ref 150–400)
RBC: 4.76 MIL/uL (ref 3.87–5.11)
RDW: 12.1 % (ref 11.5–15.5)
WBC: 6.6 10*3/uL (ref 4.0–10.5)
nRBC: 0 % (ref 0.0–0.2)

## 2019-11-25 LAB — MAGNESIUM: Magnesium: 2 mg/dL (ref 1.7–2.4)

## 2019-11-25 LAB — TROPONIN I (HIGH SENSITIVITY): Troponin I (High Sensitivity): 9 ng/L (ref <18)

## 2019-11-25 MED ORDER — GADOBUTROL 1 MMOL/ML IV SOLN
7.5000 mL | Freq: Once | INTRAVENOUS | Status: AC | PRN
  Administered 2019-11-25: 7.5 mL via INTRAVENOUS

## 2019-11-25 NOTE — Discharge Instructions (Addendum)
Go to the Emergency Department.

## 2019-11-25 NOTE — ED Provider Notes (Signed)
Roderic Palau    CSN: [id number] Arrival date & time: 11/25/19  1337      History   Chief Complaint Chief Complaint  Patient presents with  . strange sensation in body    HPI Betty Monroe is a 74 y.o. female.   Patient presents with a "strange sensation" intermittently since yesterday.  She feels unable to fully describe it but says it feels uncomfortable "maybe like tingling".  This sensation was originally in her right lower leg radiating up to her right hip but this resolved yesterday.  Today the sensation is in her right shoulder radiating down her right arm and across her chest to her neck x 5 episodes.  She states the sensation is intermittent and is not pain.  She also reports a headache and that she felt "like passing out" at the podiatrist office yesterday.  She denies focal weakness, facial asymmetry, chest pain, shortness of breath, fever, chills, nausea, diaphoresis, edema, or other symptoms.  No treatments attempted at home.    The history is provided by the patient.    Past Medical History:  Diagnosis Date  . Arthritis   . Cancer [facility]) 12/1992   breast  . Chickenpox   . GERD (gastroesophageal reflux disease)   . History of angioedema   . History of colonic polyps    adenomatous  . History of kidney stones   . Hypertension   . Lung cancer ([place]) 2005, 2015  . Lymphadenopathy   . Microscopic hematuria   . Multiple lipomas   . Shingles (herpes zoster) polyneuropathy   . Sleep apnea   . Thyroid disease     Patient Active Problem List   Diagnosis Date Noted  . Type 2 diabetes mellitus without complication, without long-term current use of insulin ([place]) 08/23/2019  . H/O: lung cancer 06/22/2018  . OSA on CPAP 12/06/2015  . Pure hypercholesterolemia 05/15/2015  . Adenocarcinoma of left lung ([place]) 05/11/2014  . Benign essential hypertension 05/11/2014  . Esophageal reflux 05/11/2014  . Hypothyroidism 05/11/2014    Past Surgical History:  Procedure  Laterality Date  . BRONCHOSCOPY  02/16/2014  . BUNIONECTOMY     x2  . COLONOSCOPY    . COLONOSCOPY WITH PROPOFOL N/A 08/25/2018   Procedure: COLONOSCOPY WITH PROPOFOL;  Surgeon: Toledo, Benay Pike, MD;  Location: [facility] ENDOSCOPY;  Service: Gastroenterology;  Laterality: N/A;  . ESOPHAGOGASTRODUODENOSCOPY    . ESOPHAGOGASTRODUODENOSCOPY (EGD) WITH PROPOFOL N/A 08/25/2018   Procedure: ESOPHAGOGASTRODUODENOSCOPY (EGD) WITH PROPOFOL;  Surgeon: Toledo, Benay Pike, MD;  Location: [facility] ENDOSCOPY;  Service: Gastroenterology;  Laterality: N/A;  . FRACTURE SURGERY     Closed reduction ankle fracture   . LYMPHADENECTOMY    . MASTECTOMY Left   . THORACOSCOPY Left    with lobectomy lung   . TONSILLECTOMY    . uvular extraction      OB History   No obstetric history on file.      Home Medications    Prior to Admission medications   Medication Sig Start Date End Date Taking? Authorizing Provider  levothyroxine (SYNTHROID) 112 MCG tablet Take 112 mcg by mouth daily before breakfast.   Yes [provider]  losartan-hydrochlorothiazide (HYZAAR) 100-25 MG tablet Take 1 tablet by mouth daily. 11/17/19  Yes [provider]  metFORMIN (GLUCOPHAGE-XR) 500 MG 24 hr tablet Take 500 mg by mouth daily. 11/17/19  Yes [provider]  VITAMIN D PO Take by mouth.   Yes [provider]  vitamin E 400  UNIT capsule Take 400 Units by mouth daily.   Yes [provider]    Family History Family History  Problem Relation Age of Onset  . Heart failure Mother   . Diabetes Mother   . Alzheimer's disease Father   . Heart failure Father   . Uterine cancer Sister   . Colon polyps Sister   . Breast cancer Sister   . Colon cancer Maternal Uncle     Social History Social History   Tobacco Use  . Smoking status: Never Smoker  . Smokeless tobacco: Never Used  Substance Use Topics  . Alcohol use: Yes    Comment: rarely  . Drug use: Never     Allergies   Lisinopril,  Latex, and Other   Review of Systems Review of Systems  Constitutional: Negative for chills, diaphoresis and fever.  HENT: Negative for ear pain and sore throat.   Eyes: Negative for pain and visual disturbance.  Respiratory: Negative for cough and shortness of breath.   Cardiovascular: Negative for chest pain and palpitations.  Gastrointestinal: Negative for abdominal pain, nausea and vomiting.  Genitourinary: Negative for dysuria and hematuria.  Musculoskeletal: Negative for back pain.  Skin: Negative for color change and rash.  Neurological: Positive for headaches. Negative for dizziness, seizures, syncope, facial asymmetry, speech difficulty, weakness and numbness.  All other systems reviewed and are negative.    Physical Exam Triage Vital Signs ED Triage Vitals  Enc Vitals Group     BP      Pulse      Resp      Temp      Temp src      SpO2      Weight      Height      Head Circumference      Peak Flow      Pain Score      Pain Loc      Pain Edu?      Excl. in [place]?    No data found.  Updated Vital Signs BP (!) 171/90 (BP Location: Right Arm)   Pulse 60   Temp 98.4 F (36.9 C) (Oral)   Resp 18   Ht 5\' 6"  (1.676 m)   Wt 210 lb (95.3 kg)   SpO2 95%   BMI 33.89 kg/m   Visual Acuity Right Eye Distance:   Left Eye Distance:   Bilateral Distance:    Right Eye Near:   Left Eye Near:    Bilateral Near:     Physical Exam Vitals and nursing note reviewed.  Constitutional:      General: She is not in acute distress.    Appearance: She is well-developed. She is not ill-appearing.  HENT:     Head: Normocephalic and atraumatic.     Mouth/Throat:     Mouth: Mucous membranes are moist.     Pharynx: Oropharynx is clear.  Eyes:     Conjunctiva/sclera: Conjunctivae normal.  Cardiovascular:     Rate and Rhythm: Normal rate and regular rhythm.     Heart sounds: Normal heart sounds. No murmur.  Pulmonary:     Effort: Pulmonary effort is normal. No respiratory  distress.     Breath sounds: Normal breath sounds. No wheezing or rhonchi.  Abdominal:     General: Bowel sounds are normal.     Palpations: Abdomen is soft.     Tenderness: There is no abdominal tenderness. There is no guarding or rebound.  Musculoskeletal:  Cervical back: Neck supple.     Right lower leg: No edema.     Left lower leg: No edema.  Skin:    General: Skin is warm and dry.     Findings: No rash.  Neurological:     General: No focal deficit present.     Mental Status: She is alert and oriented to person, place, and time.     Cranial Nerves: No cranial nerve deficit.     Sensory: No sensory deficit.     Motor: No weakness.     Coordination: Coordination normal.     Gait: Gait normal.  Psychiatric:        Mood and Affect: Mood normal.        Behavior: Behavior normal.      UC Treatments / Results  Labs (all labs ordered are listed, but only abnormal results are displayed) Labs Reviewed - No data to display  EKG   Radiology DG Foot Complete Right  Result Date: 11/23/2019 Please see detailed radiograph report in office note.   Procedures Procedures (including critical care time)  Medications Ordered in UC Medications - No data to display  Initial Impression / Assessment and Plan / UC Course  I have reviewed the triage vital signs and the nursing notes.  Pertinent labs & imaging results that were available during my care of the patient were reviewed by me and considered in my medical decision making (see chart for details).   Tingling sensation in right shoulder/arm radiating across chest and into jaw.  Headache.  No EKG available here at this time.  Sending patient to the ED.  She refuses EMS; states she drove here and will drive herself to [facility] ED.     Final Clinical Impressions(s) / UC Diagnoses   Final diagnoses:  Tingling  Acute nonintractable headache, unspecified headache type     Discharge Instructions     Go to the Emergency  Department.    ED Prescriptions    None     PDMP not reviewed this encounter.   Sharion Balloon, NP 11/25/19 1427

## 2019-11-25 NOTE — ED Provider Notes (Signed)
[facility] Emergency Department Provider Note  ____________________________________________   First MD Initiated Contact with Patient 11/25/19 1644     (approximate)  I have reviewed the triage vital signs and the nursing notes.   HISTORY  Chief Complaint Numbness    HPI Betty Monroe is a 74 y.o. female with history of breast cancer and lung cancer now in remission, hypertension, no other stroke history who comes in with a strange sensation on the right side of her body.  Patient reports having a strange sensation on the right side of her body.  She says it is in her arm and her leg.  She states it does not really feel like a tingling or numbness.  She states that she cannot really explain how it feels just does not feel right.  It comes on at random times so it is intermittent, nothing makes it better, nothing makes it worse only lasted few seconds.  She denies having any recent falls or neck pain.  She denies having this previously.  Denies any weakness, changes in her speech or other symptoms with it.  She denies any chest pain or shortness of breath.          Past Medical History:  Diagnosis Date  . Arthritis   . Cancer [facility]) 12/1992   breast  . Chickenpox   . GERD (gastroesophageal reflux disease)   . History of angioedema   . History of colonic polyps    adenomatous  . History of kidney stones   . Hypertension   . Lung cancer ([place]) 2005, 2015  . Lymphadenopathy   . Microscopic hematuria   . Multiple lipomas   . Shingles (herpes zoster) polyneuropathy   . Sleep apnea   . Thyroid disease     Patient Active Problem List   Diagnosis Date Noted  . Type 2 diabetes mellitus without complication, without long-term current use of insulin ([place]) 08/23/2019  . H/O: lung cancer 06/22/2018  . OSA on CPAP 12/06/2015  . Pure hypercholesterolemia 05/15/2015  . Adenocarcinoma of left lung ([place]) 05/11/2014  . Benign essential hypertension 05/11/2014    . Esophageal reflux 05/11/2014  . Hypothyroidism 05/11/2014    Past Surgical History:  Procedure Laterality Date  . BRONCHOSCOPY  02/16/2014  . BUNIONECTOMY     x2  . COLONOSCOPY    . COLONOSCOPY WITH PROPOFOL N/A 08/25/2018   Procedure: COLONOSCOPY WITH PROPOFOL;  Surgeon: Toledo, Benay Pike, MD;  Location: [facility] ENDOSCOPY;  Service: Gastroenterology;  Laterality: N/A;  . ESOPHAGOGASTRODUODENOSCOPY    . ESOPHAGOGASTRODUODENOSCOPY (EGD) WITH PROPOFOL N/A 08/25/2018   Procedure: ESOPHAGOGASTRODUODENOSCOPY (EGD) WITH PROPOFOL;  Surgeon: Toledo, Benay Pike, MD;  Location: [facility] ENDOSCOPY;  Service: Gastroenterology;  Laterality: N/A;  . FRACTURE SURGERY     Closed reduction ankle fracture   . LYMPHADENECTOMY    . MASTECTOMY Left   . THORACOSCOPY Left    with lobectomy lung   . TONSILLECTOMY    . uvular extraction      Prior to Admission medications   Medication Sig Start Date End Date Taking? Authorizing Provider  levothyroxine (SYNTHROID) 112 MCG tablet Take 112 mcg by mouth daily before breakfast.    [provider]  losartan-hydrochlorothiazide (HYZAAR) 100-25 MG tablet Take 1 tablet by mouth daily. 11/17/19   [provider]  metFORMIN (GLUCOPHAGE-XR) 500 MG 24 hr tablet Take 500 mg by mouth daily. 11/17/19   [provider]  VITAMIN D PO Take by mouth.    [provider]  vitamin E 400 UNIT capsule Take 400 Units by mouth daily.    [provider]    Allergies Lisinopril, Latex, and Other  Family History  Problem Relation Age of Onset  . Heart failure Mother   . Diabetes Mother   . Alzheimer's disease Father   . Heart failure Father   . Uterine cancer Sister   . Colon polyps Sister   . Breast cancer Sister   . Colon cancer Maternal Uncle     Social History Social History   Tobacco Use  . Smoking status: Never Smoker  . Smokeless tobacco: Never Used  Substance Use Topics  . Alcohol use: Yes    Comment: rarely  . Drug use:  Never      Review of Systems Constitutional: No fever/chills Eyes: No visual changes. ENT: No sore throat. Cardiovascular: Denies chest pain. Respiratory: Denies shortness of breath. Gastrointestinal: No abdominal pain.  No nausea, no vomiting.  No diarrhea.  No constipation. Genitourinary: Negative for dysuria. Musculoskeletal: Negative for back pain. Skin: Negative for rash. Neurological: Negative for headaches, focal weakness or numbness. All other ROS negative ____________________________________________   PHYSICAL EXAM:  VITAL SIGNS: ED Triage Vitals  Enc Vitals Group     BP 11/25/19 1529 (!) 161/70     Pulse Rate 11/25/19 1529 (!) 57     Resp 11/25/19 1529 18     Temp 11/25/19 1529 98 F (36.7 C)     Temp Source 11/25/19 1529 Oral     SpO2 11/25/19 1529 97 %     Weight 11/25/19 1532 210 lb (95.3 kg)     Height 11/25/19 1532 5\' 6"  (1.676 m)     Head Circumference --      Peak Flow --      Pain Score 11/25/19 1532 0     Pain Loc --      Pain Edu? --      Excl. in [place]? --     Constitutional: Alert and oriented. Well appearing and in no acute distress. Eyes: Conjunctivae are normal. EOMI. Head: Atraumatic. Nose: No congestion/rhinnorhea. Mouth/Throat: Mucous membranes are moist.   Neck: No stridor. Trachea Midline. FROM.  No C-spine tenderness Cardiovascular: Normal rate, regular rhythm. Grossly normal heart sounds.  Good peripheral circulation. Respiratory: Normal respiratory effort.  No retractions. Lungs CTAB. Gastrointestinal: Soft and nontender. No distention. No abdominal bruits.  Musculoskeletal: No lower extremity tenderness nor edema.  No joint effusions. Neurologic: Stroke scale is 0.  No weakness in her legs.  No pronator drift.  Clear nerves II through XII are intact.  Sensation intact bilaterally. Skin:  Skin is warm, dry and intact. No rash noted. Psychiatric: Mood and affect are normal. Speech and behavior are normal. GU: Deferred    ____________________________________________   LABS (all labs ordered are listed, but only abnormal results are displayed)  Labs Reviewed  COMPREHENSIVE METABOLIC PANEL - Abnormal; Notable for the following components:      Result Value   Glucose, Bld 107 (*)    All other components within normal limits  URINALYSIS, ROUTINE W REFLEX MICROSCOPIC - Abnormal; Notable for the following components:   Color, Urine YELLOW (*)    APPearance CLEAR (*)    Hgb urine dipstick SMALL (*)    Bacteria, UA RARE (*)    All other components within normal limits  CBC WITH DIFFERENTIAL/PLATELET  MAGNESIUM  TROPONIN I (HIGH SENSITIVITY)   ____________________________________________   ED ECG REPORT I, Vanessa [place],  the attending physician, personally viewed and interpreted this ECG.  EKG is sinus bradycardia rate of 49, no ST elevations, no T wave inversions, normal intervals ____________________________________________  RADIOLOGY   Official radiology report(s): CT Head Wo Contrast  Result Date: 11/25/2019 CLINICAL DATA:  Right arm tingling for 2 days EXAM: CT HEAD WITHOUT CONTRAST TECHNIQUE: Contiguous axial images were obtained from the base of the skull through the vertex without intravenous contrast. COMPARISON:  None. FINDINGS: Brain: No evidence of acute infarction, hemorrhage, hydrocephalus, extra-axial collection or mass lesion/mass effect. Vascular: No hyperdense vessel or unexpected calcification. Skull: Normal. Negative for fracture or focal lesion. Sinuses/Orbits: No acute finding. Other: None. IMPRESSION: Normal head CT for age Electronically Signed   By: Inez Catalina M.D.   On: 11/25/2019 16:03    ____________________________________________   PROCEDURES  Procedure(s) performed (including Critical Care):  Procedures   ____________________________________________   INITIAL IMPRESSION / ASSESSMENT AND PLAN / ED COURSE  Betty Monroe was evaluated in Emergency Department  on 11/25/2019 for the symptoms described in the history of present illness. She was evaluated in the context of the global COVID-19 pandemic, which necessitated consideration that the patient might be at risk for infection with the SARS-CoV-2 virus that causes COVID-19. Institutional protocols and algorithms that pertain to the evaluation of patients at risk for COVID-19 are in a state of rapid change based on information released by regulatory bodies including the CDC and federal and state organizations. These policies and algorithms were followed during the patient's care in the ED.    Patient is a 74 year old who presents with right-sided arm and leg strange sensation that is kind of like tingling but patient has a hard time really describing it.  We will labs to evaluate for electrolyte abnormalities, AKI, chest x-ray to evaluate for any kind of mass.  Patient is not having any chest pain but will make sure to get EKG and cardiac markers.  Will get CT head to evaluate for bleed.  Labs are reassuring.  CT is negative.  Discussed with patient that this does not seem consistent with a TIA given her description of symptoms but there is always a chance that this could be signs of ischemic stroke.  If it was a TIA we would not see anything on the MRI potentially although if there was a stroke we would see that.  She is okay with proceeding with MRI will do with and without contrast given her history of multiple cancers.  If MRI is negative we discussed admission for TIA work-up versus going home and following up with her primary care doctor.  Patient would rather go home that is reasonable given this does not seem to be consistent with a TIA.  She has no C-spine tenderness to suggest this being cervical pathology  Labs are reassuring.  No evidence of electrolyte abnormalities, AKI.  Cardiac markers negative I very low suspicion for ACS given no reported chest pain.  Patient had off to oncoming team pending MRI if  MRI is negative patient will most likely want to be discharged home       ____________________________________________   FINAL CLINICAL IMPRESSION(S) / ED DIAGNOSES   Final diagnoses:  Tingling      MEDICATIONS GIVEN DURING THIS VISIT:  Medications  gadobutrol (GADAVIST) 1 MMOL/ML injection 7.5 mL (has no administration in time range)     ED Discharge Orders    None       Note:  This document was prepared using Dragon  voice recognition software and may include unintentional dictation errors.   Vanessa [place], MD 11/25/19 2035

## 2019-11-25 NOTE — Discharge Instructions (Signed)
Your work-up was reassuring.  Your MRI was negative.  You should follow-up with your primary care doctor.  These do not sound like TIAs but if you develop weakness or any other symptoms associated with that you should return to the ER immediately

## 2019-11-25 NOTE — ED Triage Notes (Addendum)
Pt presents with c/o "strange sensation" to her body. She states this started last Thursday and has been off and on since then. She reports the sensation started in her leg and extended up to her hip, this resolved. She states today she had a similar sensation that started in her right shoulder and and extended down her arm and her leg, this resolved also. She is not sure how to describe the sensation, however she denies pins/needles, tingling or numbness. She states these episodes have happened about 5 times today, all starting in her shoulder. Pt also states she has a slight headache.

## 2019-11-25 NOTE — ED Notes (Signed)
Pt transported to MRI 

## 2019-11-25 NOTE — ED Triage Notes (Signed)
Per patient's report, patient has had episodes of "tingling" for two days that sometimes begins at right shoulder and radiates down right arm and across chest and other times, the sensation begins in right ankle and extends upwards. Patient denies loss of strength or balance. Patient states occasionally the right extremities feel numb, but mostly "tingling." Patient states she has had several episodes today. Patient was seen at the [facility] who sent her here. Patient has a steady gait and equal grasp. Patient has equal sensation on all extremities. Patient c/o "slight headache. I don't know if you would call it a headache."

## 2019-12-02 ENCOUNTER — Telehealth: Payer: Self-pay

## 2019-12-02 NOTE — Telephone Encounter (Signed)
-----   Message from Andres Ege, RN sent at 12/01/2019  4:30 PM EDT ----- Please assist pt. Betty Monroe ----- Message ----- From: Garrel Ridgel, [place] Sent: 11/30/2019   4:34 PM EDT To: Andres Ege, RN  Negative for fungus.

## 2019-12-02 NOTE — Telephone Encounter (Signed)
Patient has been notified of negative results

## 2019-12-05 NOTE — Progress Notes (Signed)
Pt present for annual exam. Pt denies any issues at this time.

## 2019-12-06 ENCOUNTER — Ambulatory Visit (INDEPENDENT_AMBULATORY_CARE_PROVIDER_SITE_OTHER): Payer: Medicare HMO | Admitting: Obstetrics and Gynecology

## 2019-12-06 ENCOUNTER — Other Ambulatory Visit: Payer: Self-pay

## 2019-12-06 ENCOUNTER — Encounter: Payer: Self-pay | Admitting: Obstetrics and Gynecology

## 2019-12-06 VITALS — BP 157/75 | HR 65 | Ht 66.0 in | Wt 213.3 lb

## 2019-12-06 DIAGNOSIS — I10 Essential (primary) hypertension: Secondary | ICD-10-CM | POA: Diagnosis not present

## 2019-12-06 DIAGNOSIS — Z0000 Encounter for general adult medical examination without abnormal findings: Secondary | ICD-10-CM

## 2019-12-06 DIAGNOSIS — N812 Incomplete uterovaginal prolapse: Secondary | ICD-10-CM

## 2019-12-06 DIAGNOSIS — Z85118 Personal history of other malignant neoplasm of bronchus and lung: Secondary | ICD-10-CM

## 2019-12-06 DIAGNOSIS — Z853 Personal history of malignant neoplasm of breast: Secondary | ICD-10-CM

## 2019-12-06 DIAGNOSIS — E039 Hypothyroidism, unspecified: Secondary | ICD-10-CM

## 2019-12-06 DIAGNOSIS — Z86018 Personal history of other benign neoplasm: Secondary | ICD-10-CM

## 2019-12-06 DIAGNOSIS — R102 Pelvic and perineal pain: Secondary | ICD-10-CM

## 2019-12-06 DIAGNOSIS — E119 Type 2 diabetes mellitus without complications: Secondary | ICD-10-CM

## 2019-12-06 DIAGNOSIS — Z01419 Encounter for gynecological examination (general) (routine) without abnormal findings: Secondary | ICD-10-CM

## 2019-12-06 NOTE — Patient Instructions (Signed)
Health Maintenance for Postmenopausal Women Menopause is a normal process in which your ability to get pregnant comes to an end. This process happens slowly over many months or years, usually between the ages of 41 and 37. Menopause is complete when you have missed your menstrual periods for 12 months. It is important to talk with your health care provider about some of the most common conditions that affect women after menopause (postmenopausal women). These include heart disease, cancer, and bone loss (osteoporosis). Adopting a healthy lifestyle and getting preventive care can help to promote your health and wellness. The actions you take can also lower your chances of developing some of these common conditions. What should I know about menopause? During menopause, you may get a number of symptoms, such as:  Hot flashes. These can be moderate or severe.  Night sweats.  Decrease in sex drive.  Mood swings.  Headaches.  Tiredness.  Irritability.  Memory problems.  Insomnia. Choosing to treat or not to treat these symptoms is a decision that you make with your health care provider. Do I need hormone replacement therapy?  Hormone replacement therapy is effective in treating symptoms that are caused by menopause, such as hot flashes and night sweats.  Hormone replacement carries certain risks, especially as you become older. If you are thinking about using estrogen or estrogen with progestin, discuss the benefits and risks with your health care provider. What is my risk for heart disease and stroke? The risk of heart disease, heart attack, and stroke increases as you age. One of the causes may be a change in the body's hormones during menopause. This can affect how your body uses dietary fats, triglycerides, and cholesterol. Heart attack and stroke are medical emergencies. There are many things that you can do to help prevent heart disease and stroke. Watch your blood pressure  High  blood pressure causes heart disease and increases the risk of stroke. This is more likely to develop in people who have high blood pressure readings, are of African descent, or are overweight.  Have your blood pressure checked: ? Every 3-5 years if you are 54-26 years of age. ? Every year if you are 44 years old or older. Eat a healthy diet   Eat a diet that includes plenty of vegetables, fruits, low-fat dairy products, and lean protein.  Do not eat a lot of foods that are high in solid fats, added sugars, or sodium. Get regular exercise Get regular exercise. This is one of the most important things you can do for your health. Most adults should:  Try to exercise for at least 150 minutes each week. The exercise should increase your heart rate and make you sweat (moderate-intensity exercise).  Try to do strengthening exercises at least twice each week. Do these in addition to the moderate-intensity exercise.  Spend less time sitting. Even light physical activity can be beneficial. Other tips  Work with your health care provider to achieve or maintain a healthy weight.  Do not use any products that contain nicotine or tobacco, such as cigarettes, e-cigarettes, and chewing tobacco. If you need help quitting, ask your health care provider.  Know your numbers. Ask your health care provider to check your cholesterol and your blood sugar (glucose). Continue to have your blood tested as directed by your health care provider. Do I need screening for cancer? Depending on your health history and family history, you may need to have cancer screening at different stages of your life. This  may include screening for:  Breast cancer.  Cervical cancer.  Lung cancer.  Colorectal cancer. What is my risk for osteoporosis? After menopause, you may be at increased risk for osteoporosis. Osteoporosis is a condition in which bone destruction happens more quickly than new bone creation. To help prevent  osteoporosis or the bone fractures that can happen because of osteoporosis, you may take the following actions:  If you are 63-40 years old, get at least 1,000 mg of calcium and at least 600 mg of vitamin D per day.  If you are older than age 51 but younger than age 44, get at least 1,200 mg of calcium and at least 600 mg of vitamin D per day.  If you are older than age 47, get at least 1,200 mg of calcium and at least 800 mg of vitamin D per day. Smoking and drinking excessive alcohol increase the risk of osteoporosis. Eat foods that are rich in calcium and vitamin D, and do weight-bearing exercises several times each week as directed by your health care provider. How does menopause affect my mental health? Depression may occur at any age, but it is more common as you become older. Common symptoms of depression include:  Low or sad mood.  Changes in sleep patterns.  Changes in appetite or eating patterns.  Feeling an overall lack of motivation or enjoyment of activities that you previously enjoyed.  Frequent crying spells. Talk with your health care provider if you think that you are experiencing depression. General instructions See your health care provider for regular wellness exams and vaccines. This may include:  Scheduling regular health, dental, and eye exams.  Getting and maintaining your vaccines. These include: ? Influenza vaccine. Get this vaccine each year before the flu season begins. ? Pneumonia vaccine. ? Shingles vaccine. ? Tetanus, diphtheria, and pertussis (Tdap) booster vaccine. Your health care provider may also recommend other immunizations. Tell your health care provider if you have ever been abused or do not feel safe at home. Summary  Menopause is a normal process in which your ability to get pregnant comes to an end.  This condition causes hot flashes, night sweats, decreased interest in sex, mood swings, headaches, or lack of sleep.  Treatment for this  condition may include hormone replacement therapy.  Take actions to keep yourself healthy, including exercising regularly, eating a healthy diet, watching your weight, and checking your blood pressure and blood sugar levels.  Get screened for cancer and depression. Make sure that you are up to date with all your vaccines. This information is not intended to replace advice given to you by your health care provider. Make sure you discuss any questions you have with your health care provider. Document Revised: 07/28/2018 Document Reviewed: 07/28/2018 Elsevier Patient Education  [employer].     Kegel Exercises  Kegel exercises can help strengthen your pelvic floor muscles. The pelvic floor is a group of muscles that support your rectum, small intestine, and bladder. In females, pelvic floor muscles also help support the womb (uterus). These muscles help you control the flow of urine and stool. Kegel exercises are painless and simple, and they do not require any equipment. Your provider may suggest Kegel exercises to:  Improve bladder and bowel control.  Improve sexual response.  Improve weak pelvic floor muscles after surgery to remove the uterus (hysterectomy) or pregnancy (females).  Improve weak pelvic floor muscles after prostate gland removal or surgery (males). Kegel exercises involve squeezing your pelvic floor  muscles, which are the same muscles you squeeze when you try to stop the flow of urine or keep from passing gas. The exercises can be done while sitting, standing, or lying down, but it is best to vary your position. Exercises How to do Kegel exercises: 1. Squeeze your pelvic floor muscles tight. You should feel a tight lift in your rectal area. If you are a female, you should also feel a tightness in your vaginal area. Keep your stomach, buttocks, and legs relaxed. 2. Hold the muscles tight for up to 10 seconds. 3. Breathe normally. 4. Relax your muscles. 5. Repeat  as told by your health care provider. Repeat this exercise daily as told by your health care provider. Continue to do this exercise for at least 4-6 weeks, or for as long as told by your health care provider. You may be referred to a physical therapist who can help you learn more about how to do Kegel exercises. Depending on your condition, your health care provider may recommend:  Varying how long you squeeze your muscles.  Doing several sets of exercises every day.  Doing exercises for several weeks.  Making Kegel exercises a part of your regular exercise routine. This information is not intended to replace advice given to you by your health care provider. Make sure you discuss any questions you have with your health care provider. Document Revised: 03/24/2018 Document Reviewed: 03/24/2018 Elsevier Patient Education  [place].

## 2019-12-06 NOTE — Progress Notes (Signed)
ANNUAL PREVENTATIVE CARE GYNECOLOGY  ENCOUNTER NOTE  Subjective:       Betty Monroe is a 74 y.o. G0P0000 female here for a routine annual gynecologic exam. She has a history of breast cancer, s/p chemotherapy and radiation 26 years ago. The patient is not sexually active. The patient has never been on hormone replacement therapy. Patient denies post-menopausal vaginal bleeding. The patient wears seatbelts: yes. The patient participates in regular exercise: no. Has the patient ever been transfused or tattooed?: no.    Current complaints: 1.  Intermittent right sided pelvic pain. This has been ongoing for many years. When it happens, is sharp and uncomfortable but sometimes can be dull or achy. Nothing that makes it better or worse. No aggravating or alleviating factors.  Lasts for a few seconds each time it happens. She does report a history of fibroids.     Gynecologic History No LMP recorded. Patient is postmenopausal. Last Pap: over 10 years ago. Results were: normal.  Denies history of abnormal pap smears.  Last mammogram: 02/25/2019 [facility]). Results were: normal Last Colonoscopy: 08/25/2018. Results  Last Dexa Scan: Years since her last bone density scan. No prior h/o osteoporosis.  Last Colonoscopy: 08/25/2018.  Diverticulosis. Colon polyps present.   Obstetric History OB History  Gravida Para Term Preterm AB Living  0 0 0 0 0 0  SAB TAB Ectopic Multiple Live Births  0 0 0 0 0    Past Medical History:  Diagnosis Date  . Arthritis   . Breast cancer ([place])   . Cancer [facility]) 12/1992   breast  . Chickenpox   . GERD (gastroesophageal reflux disease)   . History of angioedema   . History of colonic polyps    adenomatous  . History of kidney stones   . Hypertension   . Lung cancer ([place]) 2005, 2015  . Lung cancer ([place])   . Lymphadenopathy   . Microscopic hematuria   . Multiple lipomas   . Shingles (herpes zoster) polyneuropathy   . Sleep apnea   . Thyroid disease       Family History  Problem Relation Age of Onset  . Heart failure Mother   . Diabetes Mother   . Alzheimer's disease Father   . Heart failure Father   . Uterine cancer Sister   . Colon polyps Sister   . Breast cancer Sister   . Colon cancer Maternal Uncle     Past Surgical History:  Procedure Laterality Date  . BRONCHOSCOPY  02/16/2014  . BUNIONECTOMY     x2  . COLONOSCOPY    . COLONOSCOPY WITH PROPOFOL N/A 08/25/2018   Procedure: COLONOSCOPY WITH PROPOFOL;  Surgeon: Toledo, Benay Pike, MD;  Location: [facility] ENDOSCOPY;  Service: Gastroenterology;  Laterality: N/A;  . ESOPHAGOGASTRODUODENOSCOPY    . ESOPHAGOGASTRODUODENOSCOPY (EGD) WITH PROPOFOL N/A 08/25/2018   Procedure: ESOPHAGOGASTRODUODENOSCOPY (EGD) WITH PROPOFOL;  Surgeon: Toledo, Benay Pike, MD;  Location: [facility] ENDOSCOPY;  Service: Gastroenterology;  Laterality: N/A;  . FRACTURE SURGERY     Closed reduction ankle fracture   . LYMPHADENECTOMY    . MASTECTOMY Left   . THORACOSCOPY Left    with lobectomy lung   . TONSILLECTOMY    . uvular extraction      Social History   Socioeconomic History  . Marital status: Married    Spouse name: Not on file  . Number of children: Not on file  . Years of education: Not on file  . Highest education level: Not on file  Occupational History  . Not on file  Tobacco Use  . Smoking status: Never Smoker  . Smokeless tobacco: Never Used  Substance and Sexual Activity  . Alcohol use: Yes    Comment: rarely  . Drug use: Never  . Sexual activity: Yes  Other Topics Concern  . Not on file  Social History Narrative  . Not on file   Social Determinants of Health   Financial Resource Strain:   . Difficulty of Paying Living Expenses:   Food Insecurity:   . Worried About Charity fundraiser in the Last Year:   . Arboriculturist in the Last Year:   Transportation Needs:   . Film/video editor (Medical):   Marland Kitchen Lack of Transportation (Non-Medical):   Physical Activity:   . Days of  Exercise per Week:   . Minutes of Exercise per Session:   Stress:   . Feeling of Stress :   Social Connections:   . Frequency of Communication with Friends and Family:   . Frequency of Social Gatherings with Friends and Family:   . Attends Religious Services:   . Active Member of Clubs or Organizations:   . Attends Archivist Meetings:   Marland Kitchen Marital Status:   Intimate Partner Violence:   . Fear of Current or Ex-Partner:   . Emotionally Abused:   Marland Kitchen Physically Abused:   . Sexually Abused:     Current Outpatient Medications on File Prior to Visit  Medication Sig Dispense Refill  . levothyroxine (SYNTHROID) 112 MCG tablet Take 112 mcg by mouth daily before breakfast.    . losartan-hydrochlorothiazide (HYZAAR) 100-25 MG tablet Take 1 tablet by mouth at bedtime.     . metFORMIN (GLUCOPHAGE-XR) 500 MG 24 hr tablet Take 500 mg by mouth every evening.     Marland Kitchen VITAMIN D PO Take by mouth.    . vitamin E 400 UNIT capsule Take 400 Units by mouth daily.     No current facility-administered medications on file prior to visit.    Allergies  Allergen Reactions  . Lisinopril Swelling  . Latex Rash    Adhesive tape- silicones  . Other Rash      Review of Systems ROS Review of Systems - General ROS: negative for - chills, fatigue, fever, hot flashes, night sweats, weight gain or weight loss Psychological ROS: negative for - anxiety, decreased libido, depression, mood swings, physical abuse or sexual abuse Ophthalmic ROS: negative for - blurry vision, eye pain or loss of vision ENT ROS: negative for - headaches, hearing change, visual changes or vocal changes Allergy and Immunology ROS: negative for - hives, itchy/watery eyes or seasonal allergies Hematological and Lymphatic ROS: negative for - bleeding problems, bruising, swollen lymph nodes or weight loss Endocrine ROS: negative for - galactorrhea, hair pattern changes, hot flashes, malaise/lethargy, mood swings, palpitations,  polydipsia/polyuria, skin changes, temperature intolerance or unexpected weight changes Breast ROS: negative for - new or changing breast lumps or nipple discharge Respiratory ROS: negative for - cough or shortness of breath Cardiovascular ROS: negative for - chest pain, irregular heartbeat, palpitations or shortness of breath Gastrointestinal ROS: no abdominal pain, change in bowel habits, or black or bloody stools Genito-Urinary ROS: no dysuria, trouble voiding, or hematuria. Pelvic pain (intermittent, for years) Musculoskeletal ROS: negative for - joint pain or joint stiffness Neurological ROS: negative for - bowel and bladder control changes Dermatological ROS: negative for rash and skin lesion changes   Objective:   BP (!) 157/75  Pulse 65   Ht 5\' 6"  (1.676 m)   Wt 213 lb 4.8 oz (96.8 kg)   BMI 34.43 kg/m  CONSTITUTIONAL: Well-developed, well-nourished female in no acute distress. Mild obesity.   PSYCHIATRIC: Normal mood and affect. Normal behavior. Normal judgment and thought content. [place]: Alert and oriented to person, place, and time. Normal muscle tone coordination. No cranial nerve deficit noted. HENT:  Normocephalic, atraumatic, External right and left ear normal. Oropharynx is clear and moist EYES: Conjunctivae and EOM are normal. Pupils are equal, round, and reactive to light. No scleral icterus.  NECK: Normal range of motion, supple, no masses.  Normal thyroid.  SKIN: Skin is warm and dry. No rash noted. Not diaphoretic. No erythema. No pallor. CARDIOVASCULAR: Normal heart rate noted, regular rhythm, no murmur. RESPIRATORY: Clear to auscultation bilaterally. Effort and breath sounds normal, no problems with respiration noted. BREASTS: Symmetric in size. No masses, skin changes, nipple drainage, or lymphadenopathy.  Scar above left breast.  ABDOMEN: Soft, normal bowel sounds, no distention noted.  No tenderness, rebound or guarding.  BLADDER: Normal PELVIC:  Bladder  no bladder distension noted  Urethra: normal appearing urethra with no masses, tenderness or lesions  Vulva: normal appearing vulva with no masses, tenderness or lesions  Vagina: atrophic, no lesions or discharge.   Cervix: normal appearing cervix without discharge or lesions  Uterus: uterus is normal size, shape, consistency and nontender  Adnexa: normal adnexa in size, nontender and no masses  RV: External Exam NormaI, No Rectal Masses and Normal Sphincter tone MUSCULOSKELETAL: Normal range of motion. No tenderness.  No cyanosis, clubbing, or edema.  2+ distal pulses. LYMPHATIC: No Axillary, Supraclavicular, or Inguinal Adenopathy.   Labs: Lab Results  Component Value Date   WBC 6.6 11/25/2019   HGB 13.9 11/25/2019   HCT 41.6 11/25/2019   MCV 87.4 11/25/2019   PLT 230 11/25/2019    Lab Results  Component Value Date   CREATININE 0.63 11/25/2019   BUN 19 11/25/2019   NA 140 11/25/2019   K 3.6 11/25/2019   CL 104 11/25/2019   CO2 27 11/25/2019    Lab Results  Component Value Date   ALT 29 11/25/2019   AST 24 11/25/2019   ALKPHOS 62 11/25/2019   BILITOT 0.7 11/25/2019    No results found for: CHOL, HDL, LDLCALC, LDLDIRECT, TRIG, CHOLHDL  No results found for: TSH  No results found for: HGBA1C   Assessment:   1. Well woman exam with routine gynecological exam   2. History of breast cancer in female   3. H/O: lung cancer   4. Benign essential hypertension   5. Acquired hypothyroidism   6. Type 2 diabetes mellitus without complication, without long-term current use of insulin (HCC)   7. Pelvic pain   8. History of uterine fibroid   9. First degree uterine prolapse     Plan:  Pap: Not needed. Patient beyond age based screening.  Mammogram: Up to date. Due in June. Ordered by PCP.  Stool Guaiac Testing:  Not Ordered. Labs: None ordered. Up to date. . Routine preventative health maintenance measures emphasized: Exercise/Diet/Weight control, Tobacco Warnings,  Alcohol/Substance use risks and Stress Management.  Vitamin D and calcium recommended for osteoporosis prevention.  Intermittent pelvic pain has been going on for years, mild, intermittent.  Patient with mild prolapse, could be a cause. Discussed Kegel exercises. Can proceed with further workup if pain intensifies or increases in frequency.  Comorbidities managed by PCP.  Return to Clinic -  [place], MD [facility]

## 2019-12-08 ENCOUNTER — Encounter: Payer: Self-pay | Admitting: Obstetrics and Gynecology

## 2019-12-26 ENCOUNTER — Encounter: Payer: Self-pay | Admitting: Podiatry

## 2019-12-26 ENCOUNTER — Ambulatory Visit: Payer: Medicare HMO | Admitting: Podiatry

## 2019-12-26 ENCOUNTER — Other Ambulatory Visit: Payer: Self-pay

## 2019-12-26 DIAGNOSIS — Q828 Other specified congenital malformations of skin: Secondary | ICD-10-CM | POA: Diagnosis not present

## 2019-12-26 DIAGNOSIS — M722 Plantar fascial fibromatosis: Secondary | ICD-10-CM

## 2019-12-26 MED ORDER — MELOXICAM 15 MG PO TABS: 15.0000 mg | ORAL_TABLET | Freq: Every day | ORAL | 3 refills | Status: AC

## 2019-12-26 NOTE — Progress Notes (Signed)
She presents today for follow-up of her pathology results as well as for her plantar fasciitis.  She also states that she has a corn on her fifth toe left foot.  She states that the plantar fasciitis is 50% resolved and is currently not taking any anti-inflammatories.  Objective: Vital signs are stable she is alert and oriented x3 she has no reproducible pain on palpation medial calcaneal tubercle of the right heel.  She has a small reactive hyperkeratotic lesion overlying the fifth toe of the left foot.  No open lesions or wounds noted.  Pathology results demonstrate only nail dystrophy no onychomycosis.  Assessment: Nail dystrophy bilaterally as well as resolving plantar fasciitis greater than 50% right.  Small lesion fifth toe left foot secondary to deformity.  Plan: Discussed etiology pathology conservative versus surgical therapies.  Instructed her on how to cut the nails and keep the nails cut short and thin.  I also recommended that she start taking meloxicam 15 mg 1 p.o. daily and we sent that over for her today.  Also recommend she continue all other conservative therapy shoe gear and night splint and plantar fascial brace.  Debrided reactive hyperkeratotic lesion and placed padding.

## 2020-02-01 ENCOUNTER — Ambulatory Visit: Payer: Medicare HMO | Admitting: Podiatry

## 2020-02-08 ENCOUNTER — Other Ambulatory Visit: Payer: Self-pay

## 2020-02-08 ENCOUNTER — Ambulatory Visit: Payer: Medicare HMO | Admitting: Podiatry

## 2020-02-08 ENCOUNTER — Encounter: Payer: Self-pay | Admitting: Podiatry

## 2020-02-08 DIAGNOSIS — M722 Plantar fascial fibromatosis: Secondary | ICD-10-CM | POA: Diagnosis not present

## 2020-02-08 NOTE — Progress Notes (Signed)
She presents today for follow-up of her plantar fasciitis.  She states that it is 100% improved.  Objective: Vital signs are stable she alert and x3 she no longer has pain on palpation vehicle came typical of the right heel.  Completely resolved.  Assessment: Resolving plantar fasciitis 100%.  Plan: Follow-up with me on an as-needed basis.

## 2020-04-06 ENCOUNTER — Ambulatory Visit: Payer: Medicare HMO | Admitting: Podiatry

## 2020-08-20 IMAGING — CT CT HEAD W/O CM
3 series · 16 of 47 positions shown, 19 images · non-contrast
Comparison: None.

CLINICAL DATA: Right arm tingling for 2 days

EXAM:
CT HEAD WITHOUT CONTRAST
TECHNIQUE: Contiguous axial images were obtained from the base of the skull
through the vertex without intravenous contrast.

[Series 2: head wo · axial · 0.40mm/px · z∈[-101,+24]mm · 10 of 31 slices shown, 13 images]
[im 3/31  brain]
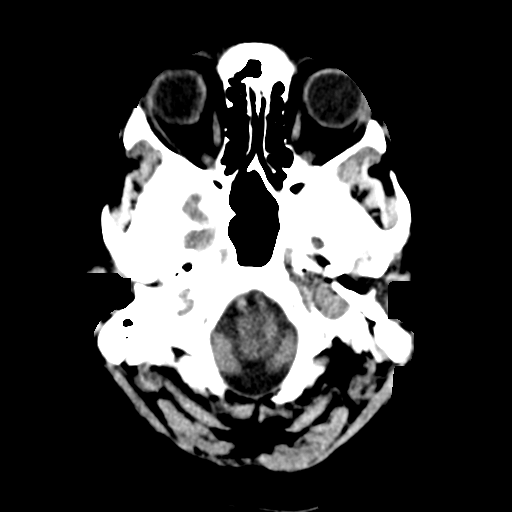
[im 3/31  bone]
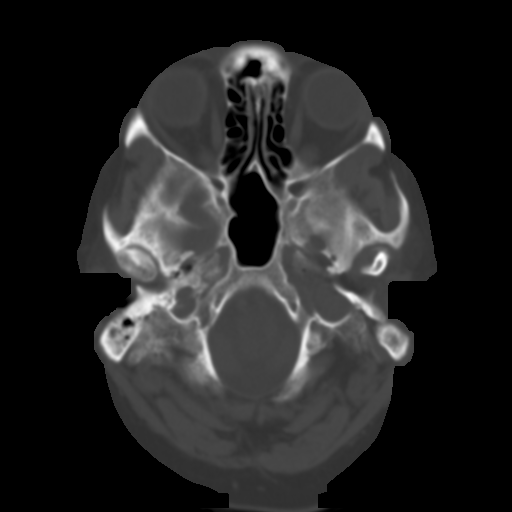
[im 6/31  brain]
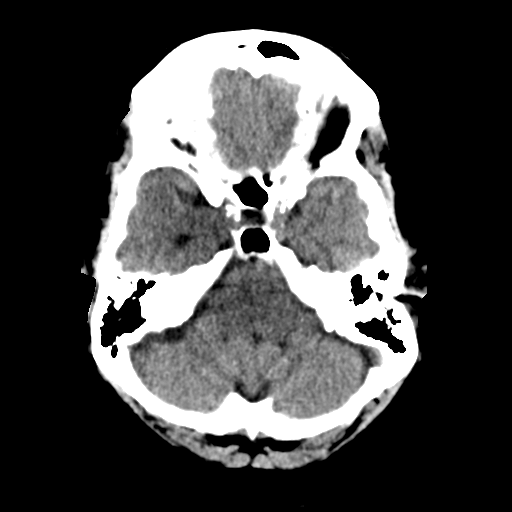
[im 9/31  brain]
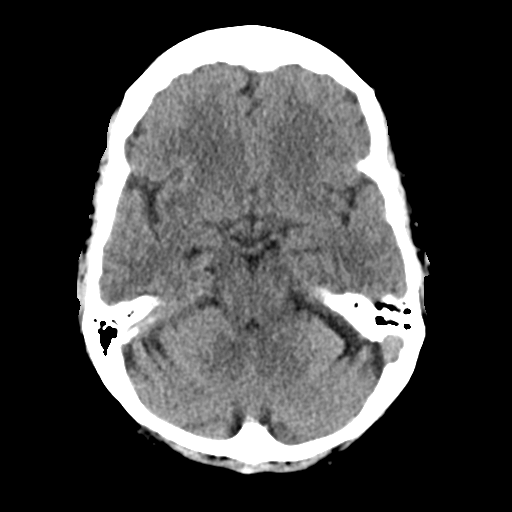
[im 11/31  brain]
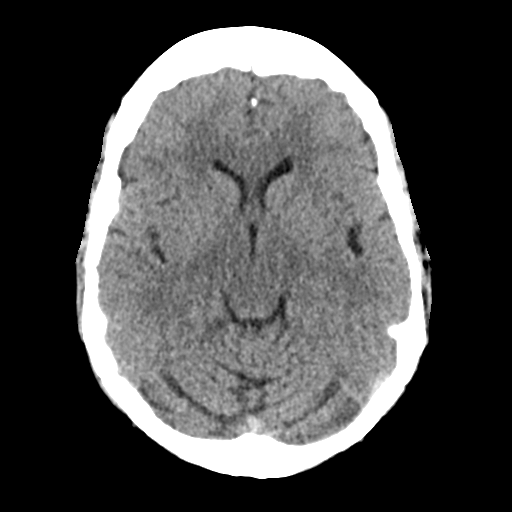
[im 14/31  brain]
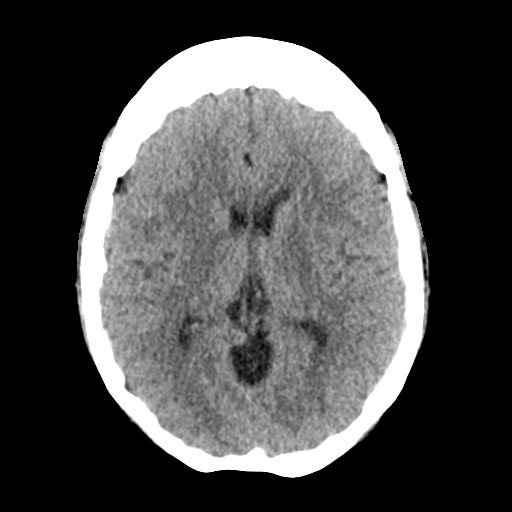
[im 14/31  bone]
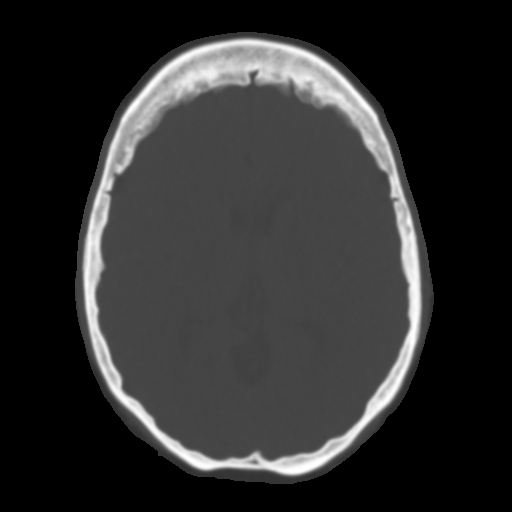
[im 17/31  brain]
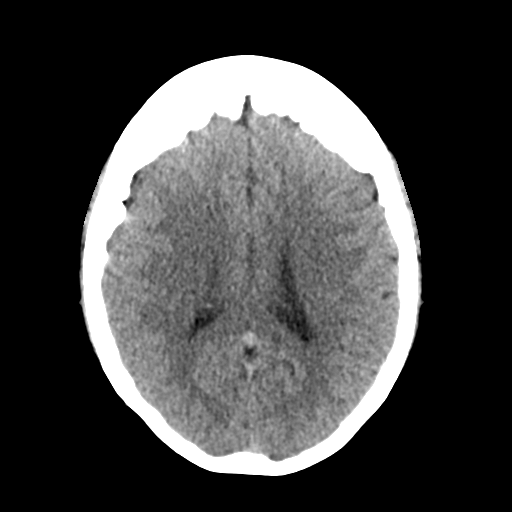
[im 20/31  brain]
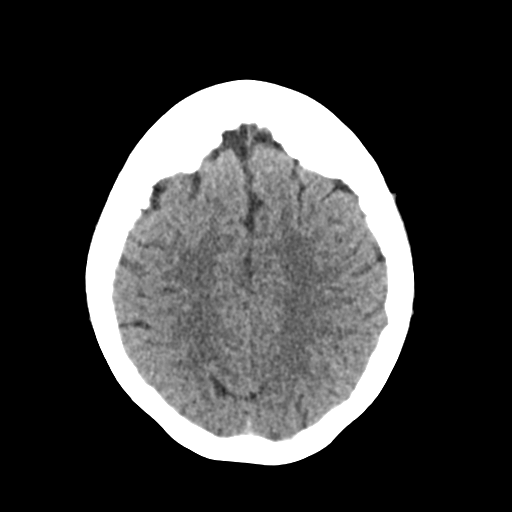
[im 23/31  brain]
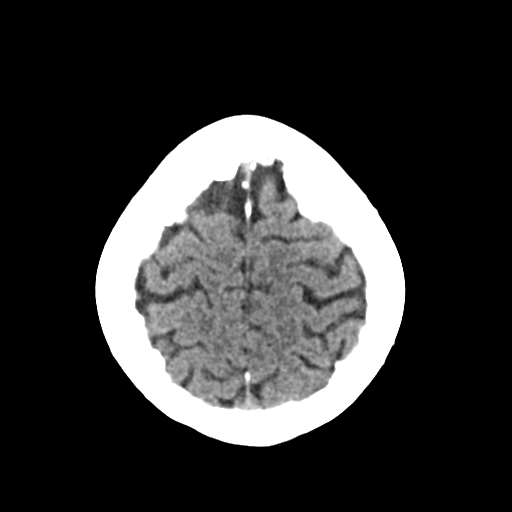
[im 25/31  brain]
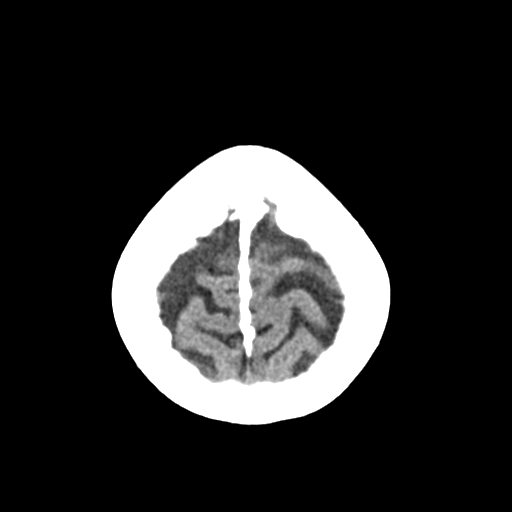
[im 25/31  bone]
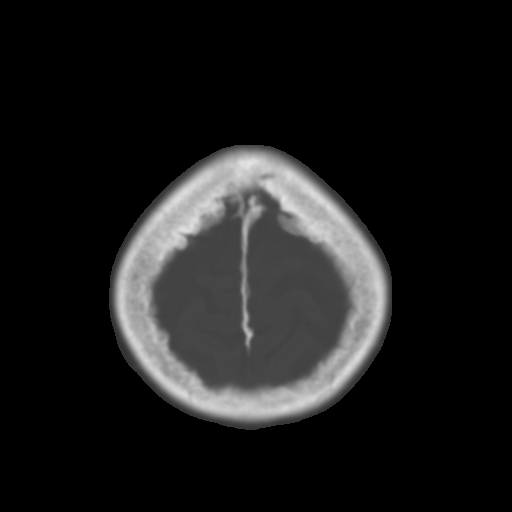
[im 28/31  brain]
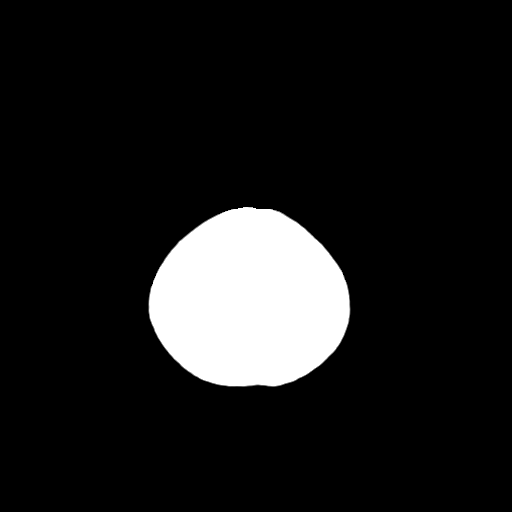

[Series 4: coronal soft tissue · coronal · 0.30mm/px · 3 of 65 slices shown]
[im 22/65  brain]
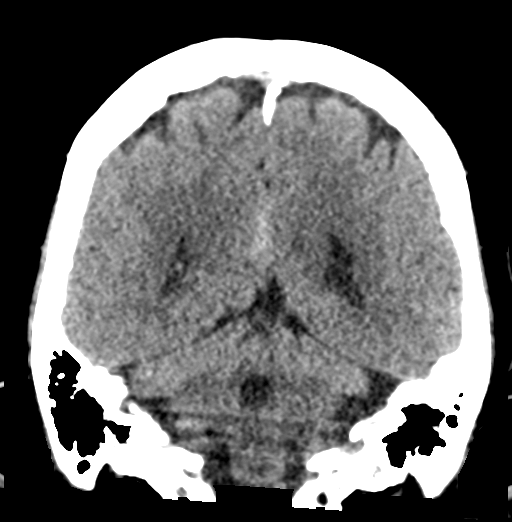
[im 29/65  brain]
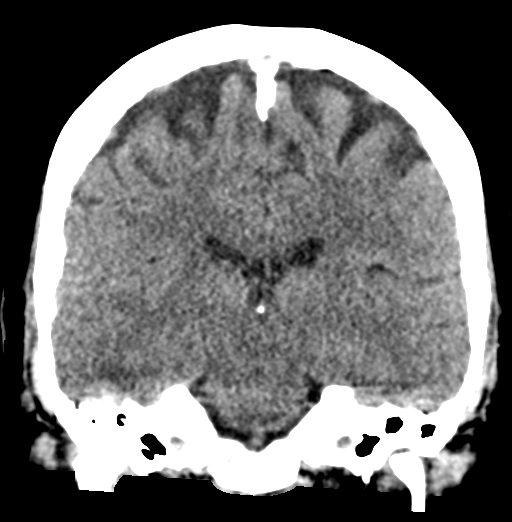
[im 36/65  brain]
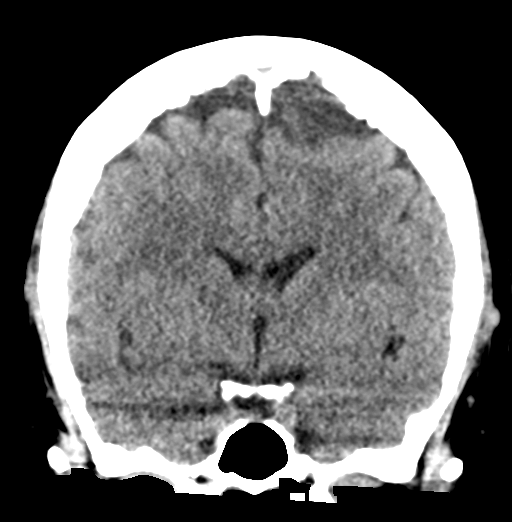

[Series 5: sagittal soft tissue · sagittal · 0.31mm/px · 3 of 59 slices shown]
[im 20/59  brain]
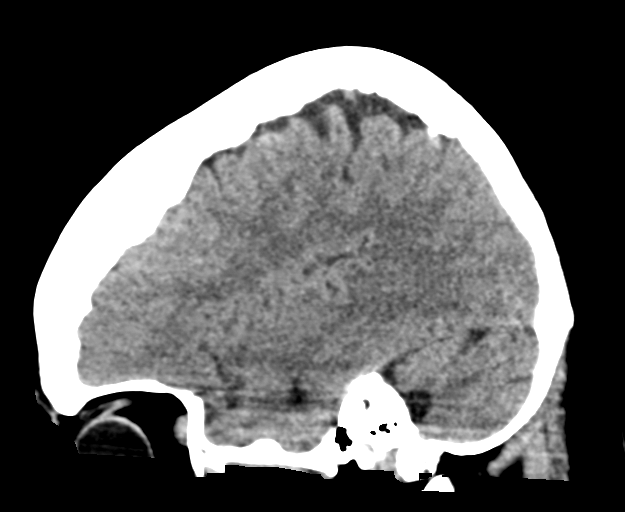
[im 30/59  brain]
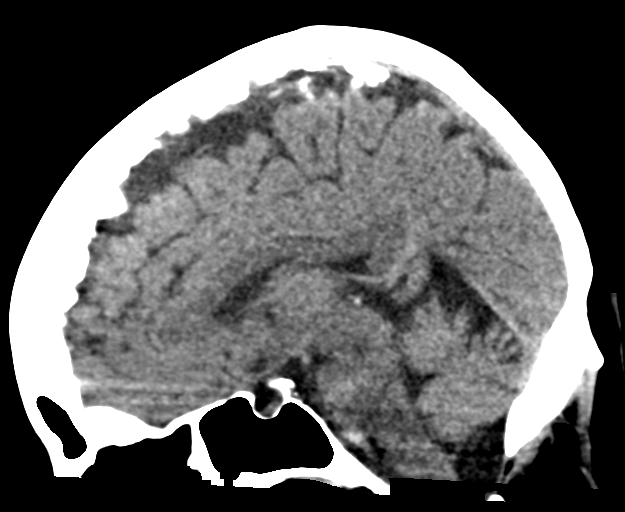
[im 39/59  brain]
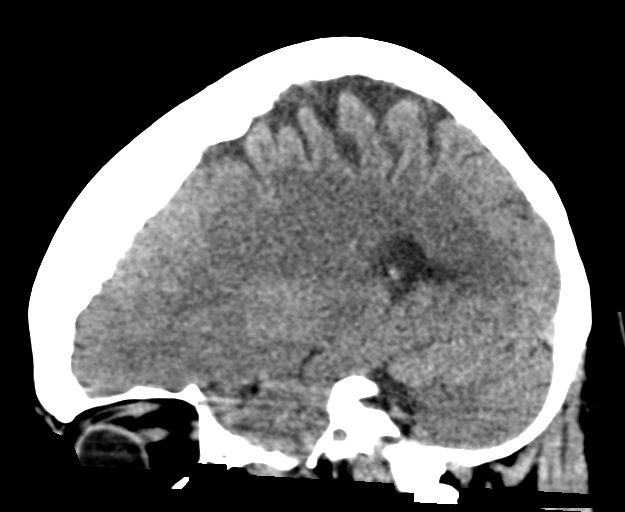

[16 of 47 positions shown; findings below may reference images not displayed]

FINDINGS: Brain: No evidence of acute infarction, hemorrhage, hydrocephalus,
extra-axial collection or mass lesion/mass effect.

Vascular: No hyperdense vessel or unexpected calcification.

Skull: Normal. Negative for fracture or focal lesion.

Sinuses/Orbits: No acute finding.

Other: None.
IMPRESSION: Normal head CT for age

## 2020-11-01 ENCOUNTER — Telehealth: Payer: Self-pay | Admitting: Obstetrics and Gynecology

## 2020-11-01 NOTE — Telephone Encounter (Signed)
I called Betty Monroe to reschedule her physical since she was scheduled on a day Dr. Marcelline Mates was not in the office.  Betty Monroe informed me she would like to cancel that appointment and not reschedule.  Betty Monroe stated that she had uterine cancer and had a full hysterectomy and has transferred her care to [facility].

## 2020-12-11 ENCOUNTER — Encounter: Payer: Medicare HMO | Admitting: Obstetrics and Gynecology

## 2021-01-07 ENCOUNTER — Ambulatory Visit: Payer: Medicare HMO | Admitting: Podiatry

## 2021-01-07 ENCOUNTER — Other Ambulatory Visit: Payer: Self-pay

## 2021-01-07 ENCOUNTER — Encounter: Payer: Self-pay | Admitting: Podiatry

## 2021-01-07 DIAGNOSIS — M722 Plantar fascial fibromatosis: Secondary | ICD-10-CM | POA: Diagnosis not present

## 2021-01-07 MED ORDER — TRIAMCINOLONE ACETONIDE 40 MG/ML IJ SUSP
20.0000 mg | Freq: Once | INTRAMUSCULAR | Status: AC
  Administered 2021-01-07: 20 mg

## 2021-01-07 NOTE — Progress Notes (Signed)
She presents today states that she has another flareup of her Planter fasciitis to her right heel.  Objective: Vital signs stable alert oriented x3.  Pulses are palpable.  Pain on palpation medial calcaneal tubercle of the right heel.  No pain on medial lateral compression of the calcaneus.  Assessment: Planter fasciitis right.  Plan: Reinjected the area today with Kenalog and local anesthetic discussed appropriate shoe gear stretching exercise ice therapy and shoe gear modifications we will follow-up with her in 1 month if necessary.

## 2021-11-23 ENCOUNTER — Ambulatory Visit (INDEPENDENT_AMBULATORY_CARE_PROVIDER_SITE_OTHER): Payer: Medicare HMO

## 2021-11-23 ENCOUNTER — Ambulatory Visit
Admission: EM | Admit: 2021-11-23 | Discharge: 2021-11-23 | Disposition: A | Discharge status: Home / Self Care | Payer: Medicare HMO

## 2021-11-23 ENCOUNTER — Encounter: Payer: Self-pay | Admitting: Emergency Medicine

## 2021-11-23 DIAGNOSIS — I10 Essential (primary) hypertension: Secondary | ICD-10-CM | POA: Diagnosis not present

## 2021-11-23 DIAGNOSIS — R0602 Shortness of breath: Secondary | ICD-10-CM

## 2021-11-23 DIAGNOSIS — R059 Cough, unspecified: Secondary | ICD-10-CM | POA: Diagnosis not present

## 2021-11-23 DIAGNOSIS — R058 Other specified cough: Secondary | ICD-10-CM

## 2021-11-23 MED ORDER — BENZONATATE 100 MG PO CAPS: 100.0000 mg | ORAL_CAPSULE | Freq: Three times a day (TID) | ORAL | 0 refills | Status: AC | PRN

## 2021-11-23 NOTE — ED Provider Notes (Signed)
?[facility] ? ? ? ?CSN: [id number] ?Arrival date & time: 11/23/21  1257 ? ? ?  ? ?History   ?Chief Complaint ?Chief Complaint  ?Patient presents with  ? Sore Throat  ? Cough  ? Nasal Congestion  ? Generalized Body Aches  ? ? ?HPI ?Betty Monroe is a 76 y.o. female.  Patient presents with nasal congestion, sore throat, productive cough, shortness of breath x3 days.  She is concerned for possible pneumonia.  She denies fever, rash, chest pain, vomiting, diarrhea, or other symptoms.  Treatment at home with Tylenol and Advil.  Her medical history includes hypertension, diabetes, lung cancer, breast cancer, endometrial cancer. ? ?The history is provided by the patient and medical records.  ? ?Past Medical History:  ?Diagnosis Date  ? Arthritis   ? Breast cancer ([place])   ? Cancer [facility]) 12/1992  ? breast  ? Chickenpox   ? GERD (gastroesophageal reflux disease)   ? History of angioedema   ? History of colonic polyps   ? adenomatous  ? History of kidney stones   ? Hypertension   ? Lung cancer ([place]) 2005, 2015  ? Lung cancer ([place])   ? Lymphadenopathy   ? Microscopic hematuria   ? Multiple lipomas   ? Shingles (herpes zoster) polyneuropathy   ? Sleep apnea   ? Thyroid disease   ? ? ?Patient Active Problem List  ? Diagnosis Date Noted  ? Type 2 diabetes mellitus without complication, without long-term current use of insulin ([place]) 08/23/2019  ? H/O: lung cancer 06/22/2018  ? OSA on CPAP 12/06/2015  ? Pure hypercholesterolemia 05/15/2015  ? 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 Benign essential hypertension 05/11/2014  ? Esophageal reflux 05/11/2014  ? Hypothyroidism 05/11/2014  ? ? ?Past Surgical History:  ?Procedure Laterality Date  ? BRONCHOSCOPY  02/16/2014  ? BUNIONECTOMY    ? x2  ? COLONOSCOPY    ? COLONOSCOPY WITH PROPOFOL N/A 08/25/2018  ? Procedure: COLONOSCOPY WITH PROPOFOL;  Surgeon: Toledo, Benay Pike, MD;  Location: [facility] END[facility]COPY;  Service: Gastroenterology;  Laterality: N/A;  ?  ESOPHAGOGASTRODUODENOSCOPY    ? ESOPHAGOGASTRODUODENOSCOPY (EGD) WITH PROPOFOL N/A 08/25/2018  ? Procedure: ESOPHAGOGASTRODUODENOSCOPY (EGD) WITH PROPOFOL;  Surgeon: Toledo, Benay Pike, MD;  Location: [facility];  Service: Gastroenterology;  Laterality: N/A;  ? FRACTURE SURGERY    ? Closed reduction ankle fracture   ? LYMPHADENECTOMY    ? MASTECTOMY Left   ? THORACOSCOPY Left   ? with lobectomy lung   ? TONSILLECTOMY    ? uvular extraction    ? ? ?OB History   ? ? Gravida  ?0  ? Para  ?0  ? Term  ?0  ? Preterm  ?0  ? AB  ?0  ? Living  ?0  ?  ? ? SAB  ?0  ? IAB  ?0  ? Ectopic  ?0  ? Multiple  ?0  ? Live Births  ?0  ?   ?  ?  ? ? ? ?Home Medications   ? ?Prior to Admission medications   ?Medication Sig Start Date End Date Taking? Authorizing Provider  ?benzonatate (TESSALON) 100 MG capsule Take 1 capsule (100 mg total) by mouth 3 (three) times daily as needed for cough. 11/23/21  Yes Sharion Balloon, NP  ?amoxicillin (AMOXIL) 500 MG capsule Take 500 mg by mouth 4 (four) times daily. 10/24/21   [provider]  ?chlorhexidine (PERIDEX) 0.12 % solution 15 mLs 2 (two) times daily. 12/10/20  [provider]  ?citalopram (CELEXA) 10 MG tablet Take 10 mg by mouth daily. 09/02/21   [provider]  ?erythromycin ophthalmic ointment SMARTSIG:In Eye(s) 07/19/21   [provider]  ?fluticasone (FLONASE) 50 MCG/ACT nasal spray Place 2 sprays into both nostrils daily. 09/04/20   [provider]  ?levothyroxine (SYNTHROID) 112 MCG tablet Take 112 mcg by mouth daily before breakfast.    [provider]  ?losartan-hydrochlorothiazide (HYZAAR) 100-25 MG tablet Take 1 tablet by mouth at bedtime.  11/17/19   [provider]  ?meloxicam (MOBIC) 15 MG tablet Take 1 tablet (15 mg total) by mouth daily. 12/26/19   Hyatt, Max T, DPM  ?metFORMIN (GLUCOPHAGE-XR) 500 MG 24 hr tablet Take 500 mg by mouth every evening.  11/17/19   [provider]  ?naproxen (NAPROSYN) 250 MG tablet  PLEASE SEE ATTACHED FOR DETAILED DIRECTIONS 12/10/20   [provider]  ?rosuvastatin (CRESTOR) 5 MG tablet Take 5 mg by mouth daily. 11/19/21   [provider]  ?triamcinolone cream (KENALOG) 0.1 % Apply topically 2 (two) times daily. 09/25/20   [provider]  ?VITAMIN D PO Take by mouth.    [provider]  ?vitamin E 400 UNIT capsule Take 400 Units by mouth daily.    [provider]  ? ? ?Family History ?Family History  ?Problem Relation Age of Onset  ? Heart failure Mother   ? Diabetes Mother   ? Alzheimer's disease Father   ? Heart failure Father   ? Uterine cancer Sister   ? Colon polyps Sister   ? Breast cancer Sister   ? Colon cancer Maternal Uncle   ? ? ?Social History ?Social History  ? ?Tobacco Use  ? Smoking status: Never  ? Smokeless tobacco: Never  ?Vaping Use  ? Vaping Use: Never used  ?Substance Use Topics  ? Alcohol use: Yes  ?  Comment: rarely  ? Drug use: Never  ? ? ? ?Allergies   ?Lisinopril, Meloxicam, Latex, and Other ? ? ?Review of Systems ?Review of Systems  ?Constitutional:  Negative for chills and fever.  ?HENT:  Positive for congestion and sore throat. Negative for ear pain.   ?Respiratory:  Positive for cough and shortness of breath.   ?Cardiovascular:  Negative for chest pain and palpitations.  ?Gastrointestinal:  Negative for diarrhea and vomiting.  ?Skin:  Negative for color change and rash.  ?Neurological:  Positive for headaches.  ?All other systems reviewed and are negative. ? ? ?Physical Exam ?Triage Vital Signs ?ED Triage Vitals  ?Enc Vitals Group  ?   BP   ?   Pulse   ?   Resp   ?   Temp   ?   Temp src   ?   SpO2   ?   Weight   ?   Height   ?   Head Circumference   ?   Peak Flow   ?   Pain Score   ?   Pain Loc   ?   Pain Edu?   ?   Excl. in [place]?   ? ?No data found. ? ?Updated Vital Signs ?BP (!) 164/80   Pulse 73   Temp 98.4 ?F (36.9 ?C)   Resp 18   SpO2 96%  ? ?Visual Acuity ?Right Eye Distance:   ?Left Eye Distance:   ?Bilateral  Distance:   ? ?Right Eye Near:   ?Left Eye Near:    ?Bilateral Near:    ? ?  Physical Exam ?Vitals and nursing note reviewed.  ?Constitutional:   ?   General: She is not in acute distress. ?   Appearance: Normal appearance. She is well-developed. She is not ill-appearing.  ?HENT:  ?   Right Ear: Tympanic membrane normal.  ?   Left Ear: Tympanic membrane normal.  ?   Nose: Nose normal.  ?   Mouth/Throat:  ?   Mouth: Mucous membranes are moist.  ?   Pharynx: Oropharynx is clear.  ?Cardiovascular:  ?   Rate and Rhythm: Normal rate and regular rhythm.  ?   Heart sounds: Normal heart sounds.  ?Pulmonary:  ?   Effort: Pulmonary effort is normal. No respiratory distress.  ?   Breath sounds: Normal breath sounds.  ?Musculoskeletal:  ?   Cervical back: Neck supple.  ?Skin: ?   General: Skin is warm and dry.  ?Neurological:  ?   Mental Status: She is alert.  ?Psychiatric:     ?   Mood and Affect: Mood normal.     ?   Behavior: Behavior normal.  ? ? ? ?UC Treatments / Results  ?Labs ?(all labs ordered are listed, but only abnormal results are displayed) ?Labs Reviewed  ?COVID-19, FLU A+B AND RSV  ? ? ?EKG ? ? ?Radiology ?DG Chest 2 View ? ?Result Date: 11/23/2021 ?CLINICAL DATA:  productive cough, SOB, Hx of lung CA EXAM: CHEST - 2 VIEW.  Patient is rotated on frontal view. COMPARISON:  Chest x-ray 11/25/2019 FINDINGS: The heart and mediastinal contours are within normal limits. No focal consolidation. No pulmonary edema. No pleural effusion. No pneumothorax. No acute osseous abnormality. Left chest wall vascular clips. IMPRESSION: No active cardiopulmonary disease. Electronically Signed   By: Iven Finn M.D.   On: 11/23/2021 15:04   ? ?Procedures ?Procedures (including critical care time) ? ?Medications Ordered in UC ?Medications - No data to display ? ?Initial Impression / Assessment and Plan / UC Course  ?I have reviewed the triage vital signs and the nursing notes. ? ?Pertinent labs & imaging results that were available  during my care of the patient were reviewed by me and considered in my medical decision making (see chart for details). ? ?Productive cough, shortness of breath, elevated blood pressure reading with hypertension.  Chest x-ray shows no ac

## 2021-11-23 NOTE — ED Triage Notes (Signed)
Pt here with cough, nasal congestion, headache and sore throat x 3 days. Pt states that she feels like she has pneumonia.  ?

## 2021-11-23 NOTE — Discharge Instructions (Addendum)
The COVID, Flu, and RSV tests are pending.  The chest xray does not show pneumonia.  Take the [facility] for cough.  Follow up with your primary care provider on Monday.   ? ?Your blood pressure is elevated today at 173/72; repeat 164/80.  Please have this rechecked by your primary care provider in 2-4 weeks.     ? ?

## 2021-11-25 LAB — COVID-19, FLU A+B AND RSV
Influenza A, NAA: NOT DETECTED
Influenza B, NAA: NOT DETECTED
RSV, NAA: NOT DETECTED
SARS-CoV-2, NAA: NOT DETECTED

## 2022-08-19 IMAGING — DX DG CHEST 2V
2 series · 2 of 2 positions shown · non-contrast
Comparison: Chest x-ray 11/25/2019

CLINICAL DATA: productive cough, SOB, Hx of lung CA

EXAM:
CHEST - 2 VIEW.  Patient is rotated on frontal view.

[chest pa]
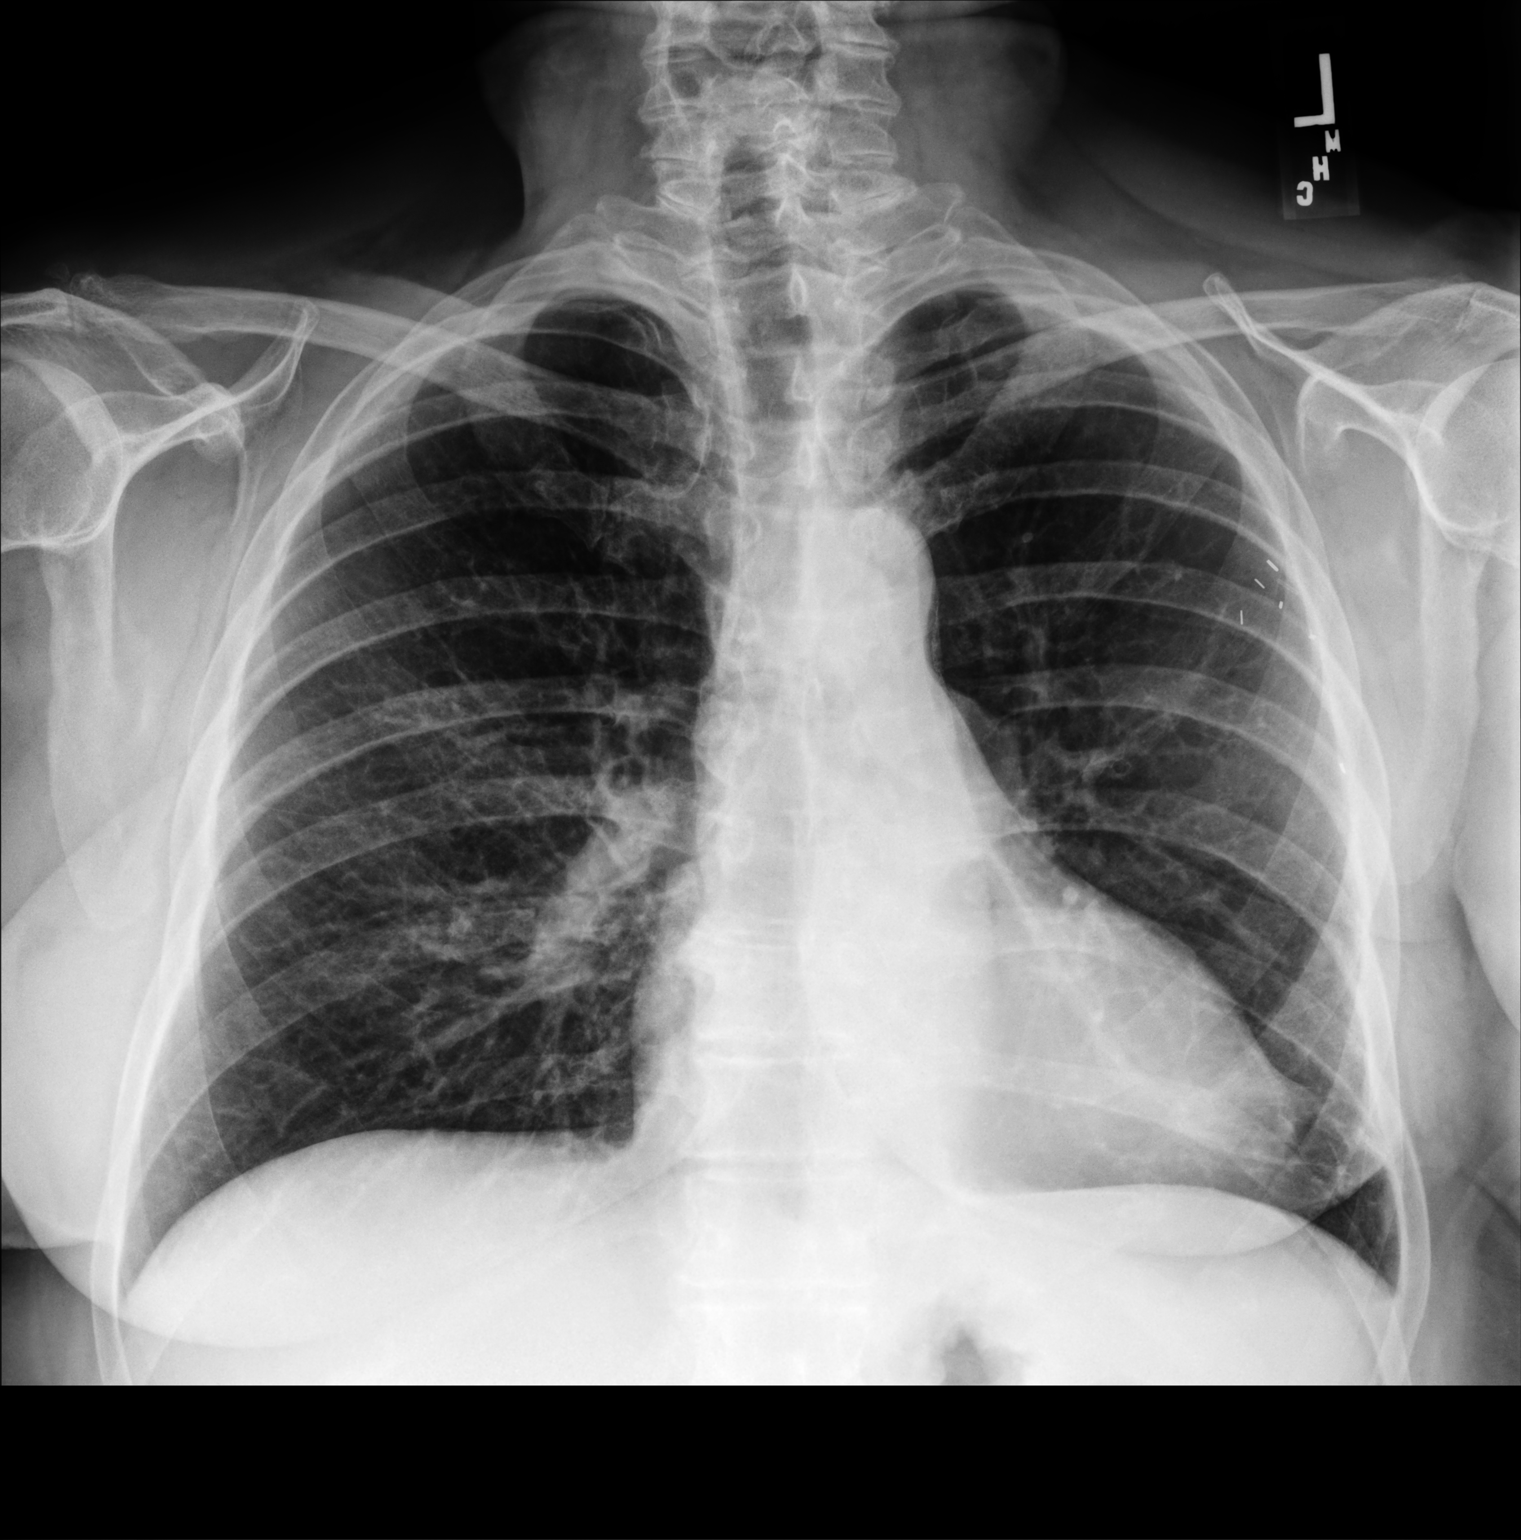

[chest lat]
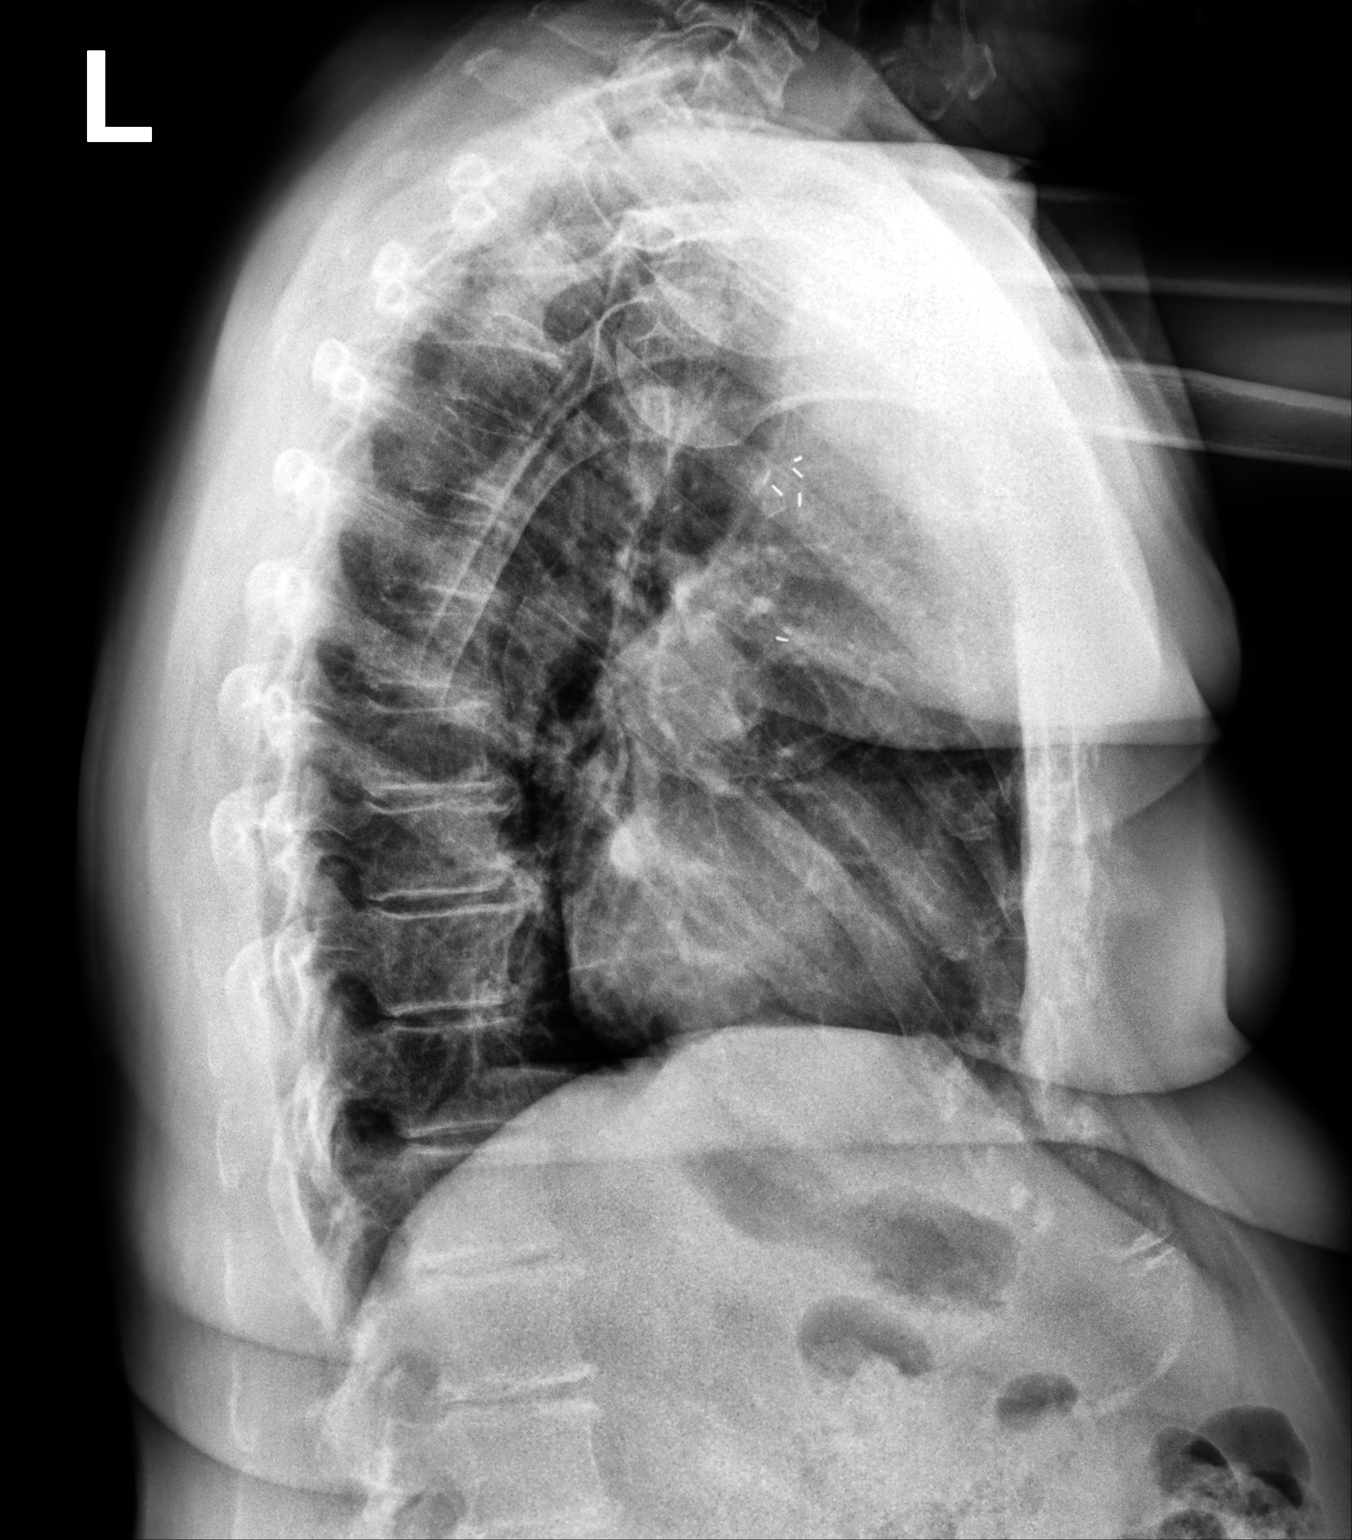

[2 of 2 positions shown; findings below may reference images not displayed]

FINDINGS: The heart and mediastinal contours are within normal limits.

No focal consolidation. No pulmonary edema. No pleural effusion. No
pneumothorax.

No acute osseous abnormality.

Left chest wall vascular clips.
IMPRESSION: No active cardiopulmonary disease.

## 2023-06-04 ENCOUNTER — Encounter: Payer: Self-pay | Admitting: Ophthalmology

## 2023-06-04 NOTE — Anesthesia Preprocedure Evaluation (Addendum)
Anesthesia Evaluation  Patient identified by MRN, date of birth, ID band Patient awake    Reviewed: Allergy & Precautions, H&P , NPO status , Patient's Chart, lab work & pertinent test results  Airway Mallampati: IV  TM Distance: <3 FB Neck ROM: Full    Dental no notable dental hx. (+) Caps   Pulmonary sleep apnea    Pulmonary exam normal breath sounds clear to auscultation       Cardiovascular hypertension, Normal cardiovascular exam Rhythm:Regular Rate:Normal     Neuro/Psych  Neuromuscular disease  negative psych ROS   GI/Hepatic Neg liver ROS,GERD  ,,  Endo/Other  diabetesHypothyroidism    Renal/GU negative Renal ROS  negative genitourinary   Musculoskeletal  (+) Arthritis ,    Abdominal   Peds negative pediatric ROS (+)  Hematology negative hematology ROS (+)   Anesthesia Other Findings OSA, CPAP, to bring CPAP with her Arthritis Cancer (HCC) GERD (gastroesophageal reflux disease) History of angioedema  History of colonic polyps History of kidney stones Hypertension Lung cancer (HCC)  Lymphadenopathy Microscopic hematuria  Multiple lipomas Sleep apnea  Thyroid disease Shingles (herpes zoster) polyneuropathy Breast cancer (HCC) Lung cancer (HCC)     Reproductive/Obstetrics negative OB ROS                             Anesthesia Physical Anesthesia Plan  ASA: 3  Anesthesia Plan: MAC   Post-op Pain Management:    Induction: Intravenous  PONV Risk Score and Plan:   Airway Management Planned: Natural Airway and Nasal Cannula  Additional Equipment:   Intra-op Plan:   Post-operative Plan:   Informed Consent: I have reviewed the patients History and Physical, chart, labs and discussed the procedure including the risks, benefits and alternatives for the proposed anesthesia with the patient or authorized representative who has indicated his/her understanding and  acceptance.     Dental Advisory Given  Plan Discussed with: Anesthesiologist, CRNA and Surgeon  Anesthesia Plan Comments: (Patient consented for risks of anesthesia including but not limited to:  - adverse reactions to medications - damage to eyes, teeth, lips or other oral mucosa - nerve damage due to positioning  - sore throat or hoarseness - Damage to heart, brain, nerves, lungs, other parts of body or loss of life  Patient voiced understanding and assent.)       Anesthesia Quick Evaluation

## 2023-06-12 NOTE — Discharge Instructions (Signed)

## 2023-06-15 ENCOUNTER — Ambulatory Visit: Payer: Medicare HMO | Admitting: Anesthesiology

## 2023-06-15 ENCOUNTER — Other Ambulatory Visit: Payer: Self-pay

## 2023-06-15 ENCOUNTER — Ambulatory Visit
Admission: RE | Admit: 2023-06-15 | Discharge: 2023-06-15 | Disposition: A | Discharge status: Home / Self Care | Payer: Medicare HMO | Attending: Ophthalmology | Admitting: Ophthalmology

## 2023-06-15 ENCOUNTER — Encounter
Admission: RE | Disposition: A | Discharge status: Home / Self Care | Payer: Self-pay | Source: Home / Self Care | Attending: Ophthalmology

## 2023-06-15 DIAGNOSIS — Z8619 Personal history of other infectious and parasitic diseases: Secondary | ICD-10-CM | POA: Diagnosis not present

## 2023-06-15 DIAGNOSIS — Z8249 Family history of ischemic heart disease and other diseases of the circulatory system: Secondary | ICD-10-CM | POA: Insufficient documentation

## 2023-06-15 DIAGNOSIS — G4730 Sleep apnea, unspecified: Secondary | ICD-10-CM | POA: Diagnosis not present

## 2023-06-15 DIAGNOSIS — I10 Essential (primary) hypertension: Secondary | ICD-10-CM | POA: Insufficient documentation

## 2023-06-15 DIAGNOSIS — E079 Disorder of thyroid, unspecified: Secondary | ICD-10-CM | POA: Insufficient documentation

## 2023-06-15 DIAGNOSIS — H2512 Age-related nuclear cataract, left eye: Secondary | ICD-10-CM | POA: Insufficient documentation

## 2023-06-15 HISTORY — PX: CATARACT EXTRACTION W/PHACO: SHX586

## 2023-06-15 LAB — GLUCOSE, CAPILLARY: Glucose-Capillary: 132 mg/dL — ABNORMAL HIGH (ref 70–99)

## 2023-06-15 SURGERY — PHACOEMULSIFICATION, CATARACT, WITH IOL INSERTION
Anesthesia: Monitor Anesthesia Care | Site: Eye | Laterality: Left

## 2023-06-15 MED ORDER — MIDAZOLAM HCL 2 MG/2ML IJ SOLN
INTRAMUSCULAR | Status: AC
  Filled 2023-06-15: qty 2

## 2023-06-15 MED ORDER — FENTANYL CITRATE (PF) 100 MCG/2ML IJ SOLN
INTRAMUSCULAR | Status: AC
  Filled 2023-06-15: qty 2

## 2023-06-15 MED ORDER — SIGHTPATH DOSE#1 BSS IO SOLN
INTRAOCULAR | Status: DC | PRN
  Administered 2023-06-15: 15 mL

## 2023-06-15 MED ORDER — LIDOCAINE HCL (PF) 2 % IJ SOLN
INTRAOCULAR | Status: DC | PRN
  Administered 2023-06-15: 1 mL via INTRAOCULAR

## 2023-06-15 MED ORDER — ARMC OPHTHALMIC DILATING DROPS
1.0000 | OPHTHALMIC | Status: DC | PRN
  Administered 2023-06-15 (×3): 1 via OPHTHALMIC

## 2023-06-15 MED ORDER — SODIUM CHLORIDE 0.9% FLUSH: 10.0000 mL | INTRAVENOUS | Status: DC | PRN

## 2023-06-15 MED ORDER — TETRACAINE HCL 0.5 % OP SOLN
1.0000 [drp] | OPHTHALMIC | Status: DC | PRN
  Administered 2023-06-15 (×3): 1 [drp] via OPHTHALMIC

## 2023-06-15 MED ORDER — TETRACAINE HCL 0.5 % OP SOLN
OPHTHALMIC | Status: AC
  Filled 2023-06-15: qty 4

## 2023-06-15 MED ORDER — MOXIFLOXACIN HCL 0.5 % OP SOLN
OPHTHALMIC | Status: DC | PRN
  Administered 2023-06-15: .2 mL via OPHTHALMIC

## 2023-06-15 MED ORDER — SIGHTPATH DOSE#1 BSS IO SOLN
INTRAOCULAR | Status: DC | PRN
  Administered 2023-06-15: 107 mL via OPHTHALMIC

## 2023-06-15 MED ORDER — FENTANYL CITRATE (PF) 100 MCG/2ML IJ SOLN
INTRAMUSCULAR | Status: DC | PRN
  Administered 2023-06-15: 50 ug via INTRAVENOUS

## 2023-06-15 MED ORDER — SIGHTPATH DOSE#1 NA HYALUR & NA CHOND-NA HYALUR IO KIT
PACK | INTRAOCULAR | Status: DC | PRN
  Administered 2023-06-15: 1 via OPHTHALMIC

## 2023-06-15 MED ORDER — MIDAZOLAM HCL 2 MG/2ML IJ SOLN
INTRAMUSCULAR | Status: DC | PRN
  Administered 2023-06-15: 1 mg via INTRAVENOUS

## 2023-06-15 MED ORDER — ARMC OPHTHALMIC DILATING DROPS
OPHTHALMIC | Status: AC
  Filled 2023-06-15: qty 0.5

## 2023-06-15 SURGICAL SUPPLY — 17 items
CANNULA ANT/CHMB 27G (MISCELLANEOUS) IMPLANT
CANNULA ANT/CHMB 27GA (MISCELLANEOUS)
CATARACT SUITE SIGHTPATH (MISCELLANEOUS) ×1
DISSECTOR HYDRO NUCLEUS 50X22 (MISCELLANEOUS) ×1 IMPLANT
FEE CATARACT SUITE SIGHTPATH (MISCELLANEOUS) ×1 IMPLANT
GLOVE SURG GAMMEX PI TX LF 7.5 (GLOVE) ×1 IMPLANT
GLOVE SURG SYN 8.5 E (GLOVE) ×1
GLOVE SURG SYN 8.5 PF PI (GLOVE) ×1 IMPLANT
LENS IOL TECNIS EYHANCE 18.5 (Intraocular Lens) IMPLANT
NDL FILTER BLUNT 18X1 1/2 (NEEDLE) ×1 IMPLANT
NEEDLE FILTER BLUNT 18X1 1/2 (NEEDLE) ×1
PACK VIT ANT 23G (MISCELLANEOUS) IMPLANT
RING MALYGIN (MISCELLANEOUS) IMPLANT
SUT ETHILON 10-0 CS-B-6CS-B-6 (SUTURE)
SUTURE EHLN 10-0 CS-B-6CS-B-6 (SUTURE) IMPLANT
SYR 3ML LL SCALE MARK (SYRINGE) ×1 IMPLANT
SYR 5ML LL (SYRINGE) ×1 IMPLANT

## 2023-06-15 NOTE — H&P (Signed)
[facility]   Primary Care Physician:  Kandyce Rud, MD Ophthalmologist: Dr. Willey Blade  Pre-Procedure History & Physical: HPI:  Betty Monroe is a 77 y.o. female here for cataract surgery.   Past Medical History:  Diagnosis Date   Arthritis    Breast cancer (HCC)    Cancer (HCC) 12/1992   breast   Chickenpox    GERD (gastroesophageal reflux disease)    History of angioedema    History of colonic polyps    adenomatous   History of kidney stones    Hypertension    Lung cancer (HCC) 2005, 2015   Lung cancer (HCC)    Lymphadenopathy    Microscopic hematuria    Multiple lipomas    Shingles (herpes zoster) polyneuropathy    Sleep apnea    Thyroid disease     Past Surgical History:  Procedure Laterality Date   BRONCHOSCOPY  02/16/2014   BUNIONECTOMY     x2   COLONOSCOPY     COLONOSCOPY WITH PROPOFOL N/A 08/25/2018   Procedure: COLONOSCOPY WITH PROPOFOL;  Surgeon: Toledo, Boykin Nearing, MD;  Location: [facility] ENDOSCOPY;  Service: Gastroenterology;  Laterality: N/A;   ESOPHAGOGASTRODUODENOSCOPY     ESOPHAGOGASTRODUODENOSCOPY (EGD) WITH PROPOFOL N/A 08/25/2018   Procedure: ESOPHAGOGASTRODUODENOSCOPY (EGD) WITH PROPOFOL;  Surgeon: Toledo, Boykin Nearing, MD;  Location: [facility] ENDOSCOPY;  Service: Gastroenterology;  Laterality: N/A;   FRACTURE SURGERY     Closed reduction ankle fracture    LYMPHADENECTOMY     MASTECTOMY Left    THORACOSCOPY Left    with lobectomy lung    TONSILLECTOMY     uvular extraction      Prior to Admission medications   Medication Sig Start Date End Date Taking? Authorizing Provider  amoxicillin (AMOXIL) 500 MG capsule Take 500 mg by mouth 4 (four) times daily. 10/24/21  Yes [provider]  citalopram (CELEXA) 10 MG tablet Take 10 mg by mouth daily. 09/02/21  Yes [provider]  fluticasone (FLONASE) 50 MCG/ACT nasal spray Place 2 sprays into both nostrils daily. 09/04/20  Yes [provider]  glipiZIDE (GLUCOTROL XL) 5 MG  24 hr tablet Take 5 mg by mouth daily with breakfast.   Yes [provider]  levothyroxine (SYNTHROID) 112 MCG tablet Take 112 mcg by mouth daily before breakfast.   Yes [provider]  losartan-hydrochlorothiazide (HYZAAR) 100-25 MG tablet Take 1 tablet by mouth at bedtime.  11/17/19  Yes [provider]  metFORMIN (GLUCOPHAGE-XR) 500 MG 24 hr tablet Take 500 mg by mouth 2 (two) times daily with a meal. 11/17/19  Yes [provider]  rosuvastatin (CRESTOR) 5 MG tablet Take 5 mg by mouth daily. 11/19/21  Yes [provider]  VITAMIN D PO Take by mouth.   Yes [provider]  vitamin E 400 UNIT capsule Take 400 Units by mouth daily.   Yes [provider]  benzonatate (TESSALON) 100 MG capsule Take 1 capsule (100 mg total) by mouth 3 (three) times daily as needed for cough. Patient not taking: Reported on 06/04/2023 11/23/21   Betty Bail, NP  chlorhexidine (PERIDEX) 0.12 % solution 15 mLs 2 (two) times daily. Patient not taking: Reported on 06/04/2023 12/10/20   [provider]  erythromycin ophthalmic ointment SMARTSIG:In Eye(s) Patient not taking: Reported on 06/04/2023 07/19/21   [provider]  meloxicam (MOBIC) 15 MG tablet Take 1 tablet (15 mg total) by mouth daily. Patient not taking: Reported on 06/04/2023 12/26/19   Betty Monroe,  DPM  naproxen (NAPROSYN) 250 MG tablet PLEASE SEE ATTACHED FOR DETAILED DIRECTIONS Patient not taking: Reported on 06/04/2023 12/10/20   [provider]  triamcinolone cream (KENALOG) 0.1 % Apply topically 2 (two) times daily. Patient not taking: Reported on 06/04/2023 09/25/20   [provider]    Allergies as of 06/01/2023 - Review Complete 11/23/2021  Allergen Reaction Noted   Lisinopril Swelling 09/27/2017   Meloxicam Other (See Comments) 02/08/2020   Latex Rash 09/27/2017   Other Rash 01/31/2014    Family History  Problem Relation Age of Onset   Heart failure  Mother    Diabetes Mother    Alzheimer's disease Father    Heart failure Father    Uterine cancer Sister    Colon polyps Sister    Breast cancer Sister    Colon cancer Maternal Uncle     Social History   Socioeconomic History   Marital status: Married    Spouse name: Not on file   Number of children: Not on file   Years of education: Not on file   Highest education level: Not on file  Occupational History   Not on file  Tobacco Use   Smoking status: Never   Smokeless tobacco: Never  Vaping Use   Vaping status: Never Used  Substance and Sexual Activity   Alcohol use: Yes    Comment: rarely   Drug use: Never   Sexual activity: Yes  Other Topics Concern   Not on file  Social History Narrative   Not on file   Social Determinants of Health   Financial Resource Strain: Low Risk  (09/04/2022)   Received from [facility], [facility]   Overall Financial Resource Strain (CARDIA)    Difficulty of Paying Living Expenses: Not hard at all  Food Insecurity: No Food Insecurity (09/04/2022)   Received from [facility], [facility]   Hunger Vital Sign    Worried About Running Out of Food in the Last Year: Never true    Ran Out of Food in the Last Year: Never true  Transportation Needs: No Transportation Needs (09/04/2022)   Received from [facility], [facility]   [facility] - Transportation    In the past 12 months, has lack of transportation kept you from medical appointments or from getting medications?: No    Lack of Transportation (Non-Medical): No  Physical Activity: Not on file  Stress: Not on file  Social Connections: Not on file  Intimate Partner Violence: Not on file    Review of Systems: See HPI, otherwise negative ROS  Physical Exam: BP (!) 158/72   Pulse 71   Temp 97.8 F (36.6 C) (Temporal)   Resp 18   Ht 5\' 6"  (1.676 m)   Wt 95.3 kg   SpO2 96%   BMI  33.89 kg/m  General:   Alert, cooperative in NAD Head:  Normocephalic and atraumatic. Respiratory:  Normal work of breathing. Cardiovascular:  RRR  Impression/Plan: Betty Monroe is here for cataract surgery.  Risks, benefits, limitations, and alternatives regarding cataract surgery have been reviewed with the patient.  Questions have been answered.  All parties agreeable.   Willey Blade, MD  06/15/2023, 8:01 AM

## 2023-06-15 NOTE — Op Note (Signed)
OPERATIVE NOTE  Betty Monroe [id number] 06/15/2023   PREOPERATIVE DIAGNOSIS:  Nuclear sclerotic cataract left eye.  H25.12   POSTOPERATIVE DIAGNOSIS:    Nuclear sclerotic cataract left eye.     PROCEDURE:  Phacoemusification with posterior chamber intraocular lens placement of the left eye   LENS:   Implant Name Type Inv. Item Serial No. Manufacturer Lot No. LRB No. Used Action  LENS IOL TECNIS EYHANCE 18.5 - [id number] Intraocular Lens LENS IOL TECNIS EYHANCE 18.5 [id number] [employer]  Left 1 Implanted      Procedure(s) with comments: CATARACT EXTRACTION PHACO AND INTRAOCULAR LENS PLACEMENT (IOC) LEFT DIABETIC (Left) - 5.40 0:42.6  SURGEON:  Willey Blade, MD, MPH   ANESTHESIA:  Topical with tetracaine drops augmented with 1% preservative-free intracameral lidocaine.  ESTIMATED BLOOD LOSS: <1 mL   COMPLICATIONS:  None.   DESCRIPTION OF PROCEDURE:  The patient was identified in the holding room and transported to the operating room and placed in the supine position under the operating microscope.  The left eye was identified as the operative eye and it was prepped and draped in the usual sterile ophthalmic fashion.   A 1.0 millimeter clear-corneal paracentesis was made at the 5:00 position. 0.5 ml of preservative-free 1% lidocaine with epinephrine was injected into the anterior chamber.  The anterior chamber was filled with viscoelastic.  A 2.4 millimeter keratome was used to make a near-clear corneal incision at the 2:00 position.  A curvilinear capsulorrhexis was made with a cystotome and capsulorrhexis forceps.  Balanced salt solution was used to hydrodissect and hydrodelineate the nucleus.   Phacoemulsification was then used in stop and chop fashion to remove the lens nucleus and epinucleus.  The remaining cortex was then removed using the irrigation and aspiration handpiece. Viscoelastic was then placed into the capsular bag to distend it for lens placement.  A lens was then  injected into the capsular bag.  The remaining viscoelastic was aspirated.   Wounds were hydrated with balanced salt solution.  The anterior chamber was inflated to a physiologic pressure with balanced salt solution.  Intracameral vigamox 0.1 mL undiltued was injected into the eye and a drop placed onto the ocular surface.  No wound leaks were noted.  The patient was taken to the recovery room in stable condition without complications of anesthesia or surgery  Willey Blade 06/15/2023, 8:26 AM

## 2023-06-15 NOTE — Transfer of Care (Signed)
Immediate Anesthesia Transfer of Care Note  Patient: Betty Monroe  Procedure(s) Performed: CATARACT EXTRACTION PHACO AND INTRAOCULAR LENS PLACEMENT (IOC) LEFT DIABETIC (Left: Eye)  Patient Location: PACU  Anesthesia Type:MAC  Level of Consciousness: awake and patient cooperative  Airway & Oxygen Therapy: Patient Spontanous Breathing  Post-op Assessment: Report given to RN, Post -op Vital signs reviewed and stable, and Patient moving all extremities X 4  Post vital signs: Reviewed and stable  Last Vitals:  Vitals Value Taken Time  BP    Temp    Pulse 67 06/15/23 0828  Resp 10 06/15/23 0828  SpO2 99 % 06/15/23 0828  Vitals shown include unfiled device data.  Last Pain:  Vitals:   06/15/23 0712  TempSrc: Temporal  PainSc: 0-No pain         Complications: No notable events documented.

## 2023-06-15 NOTE — Plan of Care (Signed)
 [facility] Tonsillectomy/Adenoidectomy, Postoperative PEDS care plan entered in error.

## 2023-06-15 NOTE — Anesthesia Postprocedure Evaluation (Signed)
Anesthesia Post Note  Patient: Drucie Scallon  Procedure(s) Performed: CATARACT EXTRACTION PHACO AND INTRAOCULAR LENS PLACEMENT (IOC) LEFT DIABETIC (Left: Eye)  Patient location during evaluation: PACU Anesthesia Type: MAC Level of consciousness: awake and alert Pain management: pain level controlled Vital Signs Assessment: post-procedure vital signs reviewed and stable Respiratory status: spontaneous breathing, nonlabored ventilation, respiratory function stable and patient connected to nasal cannula oxygen Cardiovascular status: stable and blood pressure returned to baseline Postop Assessment: no apparent nausea or vomiting Anesthetic complications: no   No notable events documented.   Last Vitals:  Vitals:   06/15/23 0828 06/15/23 0830  BP: 131/67 138/85  Pulse: 67 61  Resp: 10 13  Temp: 36.7 C   SpO2: 99% 97%    Last Pain:  Vitals:   06/15/23 0828  TempSrc:   PainSc: 0-No pain                 Marisue Humble

## 2023-06-16 ENCOUNTER — Encounter: Payer: Self-pay | Admitting: Ophthalmology

## 2023-08-10 NOTE — Anesthesia Preprocedure Evaluation (Signed)
Anesthesia Evaluation    Airway Mallampati: IV  TM Distance: <3 FB     Dental   Pulmonary           Cardiovascular hypertension,      Neuro/Psych    GI/Hepatic   Endo/Other    Renal/GU      Musculoskeletal   Abdominal   Peds  Hematology   Anesthesia Other Findings Previous cataract surgery 06-15-23 with Dr. Juel Burrow anesthesiologist, Hoover Browns CRNA, previous meds versed 1 mg IV, fentanyl 50 mcg IV  Arthritis  Cancer  GERD (gastroesophageal reflux disease) History of angioedema  History of colonic polyps History of kidney stones  Hypertension Lung cancer (HCC)  Lymphadenopathy Microscopic hematuria  Multiple lipomas Sleep apnea  Thyroid disease Shingles (herpes zoster) polyneuropathy Breast cancer (HCC) Lung cancer (HCC)     Reproductive/Obstetrics                              Anesthesia Physical Anesthesia Plan  ASA: 3  Anesthesia Plan: MAC   Post-op Pain Management:    Induction: Intravenous  PONV Risk Score and Plan:   Airway Management Planned: Natural Airway and Nasal Cannula  Additional Equipment:   Intra-op Plan:   Post-operative Plan:   Informed Consent: I have reviewed the patients History and Physical, chart, labs and discussed the procedure including the risks, benefits and alternatives for the proposed anesthesia with the patient or authorized representative who has indicated his/her understanding and acceptance.     Dental Advisory Given  Plan Discussed with: Anesthesiologist, CRNA and Surgeon  Anesthesia Plan Comments: (Patient consented for risks of anesthesia including but not limited to:  - adverse reactions to medications - damage to eyes, teeth, lips or other oral mucosa - nerve damage due to positioning  - sore throat or hoarseness - Damage to heart, brain, nerves, lungs, other parts of body or loss of life  Patient voiced understanding  and assent.)         Anesthesia Quick Evaluation

## 2023-08-20 NOTE — Discharge Instructions (Signed)

## 2023-08-31 ENCOUNTER — Ambulatory Visit: Payer: Medicare HMO | Admitting: Anesthesiology

## 2023-08-31 ENCOUNTER — Encounter: Payer: Self-pay | Admitting: Ophthalmology

## 2023-08-31 ENCOUNTER — Encounter
Admission: RE | Disposition: A | Discharge status: Home / Self Care | Payer: Self-pay | Source: Ambulatory Visit | Attending: Ophthalmology

## 2023-08-31 ENCOUNTER — Other Ambulatory Visit: Payer: Self-pay

## 2023-08-31 ENCOUNTER — Ambulatory Visit
Admission: RE | Admit: 2023-08-31 | Discharge: 2023-08-31 | Disposition: A | Discharge status: Home / Self Care | Payer: Medicare HMO | Source: Ambulatory Visit | Attending: Ophthalmology | Admitting: Ophthalmology

## 2023-08-31 DIAGNOSIS — E1136 Type 2 diabetes mellitus with diabetic cataract: Secondary | ICD-10-CM | POA: Diagnosis not present

## 2023-08-31 DIAGNOSIS — H2511 Age-related nuclear cataract, right eye: Secondary | ICD-10-CM | POA: Diagnosis present

## 2023-08-31 DIAGNOSIS — I10 Essential (primary) hypertension: Secondary | ICD-10-CM | POA: Diagnosis not present

## 2023-08-31 DIAGNOSIS — G4730 Sleep apnea, unspecified: Secondary | ICD-10-CM | POA: Insufficient documentation

## 2023-08-31 DIAGNOSIS — Z7984 Long term (current) use of oral hypoglycemic drugs: Secondary | ICD-10-CM | POA: Diagnosis not present

## 2023-08-31 DIAGNOSIS — K219 Gastro-esophageal reflux disease without esophagitis: Secondary | ICD-10-CM | POA: Diagnosis not present

## 2023-08-31 HISTORY — PX: CATARACT EXTRACTION W/PHACO: SHX586

## 2023-08-31 LAB — GLUCOSE, CAPILLARY: Glucose-Capillary: 135 mg/dL — ABNORMAL HIGH (ref 70–99)

## 2023-08-31 SURGERY — PHACOEMULSIFICATION, CATARACT, WITH IOL INSERTION
Anesthesia: Monitor Anesthesia Care | Site: Eye | Laterality: Right

## 2023-08-31 MED ORDER — SIGHTPATH DOSE#1 NA HYALUR & NA CHOND-NA HYALUR IO KIT
PACK | INTRAOCULAR | Status: DC | PRN
  Administered 2023-08-31: 1 via OPHTHALMIC

## 2023-08-31 MED ORDER — FENTANYL CITRATE (PF) 100 MCG/2ML IJ SOLN
INTRAMUSCULAR | Status: AC
  Filled 2023-08-31: qty 2

## 2023-08-31 MED ORDER — MIDAZOLAM HCL 2 MG/2ML IJ SOLN
INTRAMUSCULAR | Status: DC | PRN
  Administered 2023-08-31: 1 mg via INTRAVENOUS

## 2023-08-31 MED ORDER — FENTANYL CITRATE (PF) 100 MCG/2ML IJ SOLN
INTRAMUSCULAR | Status: DC | PRN
  Administered 2023-08-31: 25 ug via INTRAVENOUS

## 2023-08-31 MED ORDER — TETRACAINE HCL 0.5 % OP SOLN
1.0000 [drp] | OPHTHALMIC | Status: DC | PRN
  Administered 2023-08-31 (×3): 1 [drp] via OPHTHALMIC

## 2023-08-31 MED ORDER — SIGHTPATH DOSE#1 BSS IO SOLN
INTRAOCULAR | Status: DC | PRN
  Administered 2023-08-31: 15 mL

## 2023-08-31 MED ORDER — SIGHTPATH DOSE#1 BSS IO SOLN
INTRAOCULAR | Status: DC | PRN
  Administered 2023-08-31: 76 mL via OPHTHALMIC

## 2023-08-31 MED ORDER — MOXIFLOXACIN HCL 0.5 % OP SOLN
OPHTHALMIC | Status: DC | PRN
  Administered 2023-08-31: .2 mL via OPHTHALMIC

## 2023-08-31 MED ORDER — TETRACAINE HCL 0.5 % OP SOLN
OPHTHALMIC | Status: AC
  Filled 2023-08-31: qty 4

## 2023-08-31 MED ORDER — ARMC OPHTHALMIC DILATING DROPS
OPHTHALMIC | Status: AC
  Filled 2023-08-31: qty 0.5

## 2023-08-31 MED ORDER — MIDAZOLAM HCL 2 MG/2ML IJ SOLN
INTRAMUSCULAR | Status: AC
  Filled 2023-08-31: qty 2

## 2023-08-31 MED ORDER — LIDOCAINE HCL (PF) 2 % IJ SOLN
INTRAOCULAR | Status: DC | PRN
  Administered 2023-08-31: 1 mL via INTRAOCULAR

## 2023-08-31 MED ORDER — ARMC OPHTHALMIC DILATING DROPS
1.0000 | OPHTHALMIC | Status: DC | PRN
  Administered 2023-08-31 (×3): 1 via OPHTHALMIC

## 2023-08-31 SURGICAL SUPPLY — 17 items
CANNULA ANT/CHMB 27G (MISCELLANEOUS) IMPLANT
CANNULA ANT/CHMB 27GA (MISCELLANEOUS)
CATARACT SUITE SIGHTPATH (MISCELLANEOUS) ×1
DISSECTOR HYDRO NUCLEUS 50X22 (MISCELLANEOUS) ×1 IMPLANT
FEE CATARACT SUITE SIGHTPATH (MISCELLANEOUS) ×1 IMPLANT
GLOVE PI ULTRA LF STRL 7.5 (GLOVE) ×1 IMPLANT
GLOVE SURG POLYISOPRENE 8.5 (GLOVE) ×1
GLOVE SURG SYN 8.5 PF PI BL (GLOVE) ×1 IMPLANT
LENS IOL TECNIS EYHANCE 19.0 (Intraocular Lens) IMPLANT
NDL FILTER BLUNT 18X1 1/2 (NEEDLE) ×1 IMPLANT
NEEDLE FILTER BLUNT 18X1 1/2 (NEEDLE) ×1
PACK VIT ANT 23G (MISCELLANEOUS) IMPLANT
RING MALYGIN (MISCELLANEOUS) IMPLANT
SUT ETHILON 10-0 CS-B-6CS-B-6 (SUTURE)
SUTURE EHLN 10-0 CS-B-6CS-B-6 (SUTURE) IMPLANT
SYR 3ML LL SCALE MARK (SYRINGE) ×1 IMPLANT
SYR 5ML LL (SYRINGE) ×1 IMPLANT

## 2023-08-31 NOTE — Transfer of Care (Signed)
 Immediate Anesthesia Transfer of Care Note  Patient: Betty Monroe  Procedure(s) Performed: CATARACT EXTRACTION PHACO AND INTRAOCULAR LENS PLACEMENT (IOC) RIGHT DIABETIC (Right: Eye)  Patient Location: PACU  Anesthesia Type: MAC  Level of Consciousness: awake, alert  and patient cooperative  Airway and Oxygen Therapy: Patient Spontanous Breathing and Patient connected to supplemental oxygen  Post-op Assessment: Post-op Vital signs reviewed, Patient's Cardiovascular Status Stable, Respiratory Function Stable, Patent Airway and No signs of Nausea or vomiting  Post-op Vital Signs: Reviewed and stable  Complications: No notable events documented.

## 2023-08-31 NOTE — Anesthesia Postprocedure Evaluation (Signed)
 Anesthesia Post Note  Patient: Betty Monroe  Procedure(s) Performed: CATARACT EXTRACTION PHACO AND INTRAOCULAR LENS PLACEMENT (IOC) RIGHT DIABETIC (Right: Eye)  Patient location during evaluation: PACU Anesthesia Type: MAC Level of consciousness: awake and alert Pain management: pain level controlled Vital Signs Assessment: post-procedure vital signs reviewed and stable Respiratory status: spontaneous breathing, nonlabored ventilation, respiratory function stable and patient connected to nasal cannula oxygen Cardiovascular status: blood pressure returned to baseline and stable Postop Assessment: no apparent nausea or vomiting Anesthetic complications: no  No notable events documented.   Last Vitals:  Vitals:   08/31/23 0957 08/31/23 0958  BP: 123/72   Pulse: (!) 58 63  Resp: 15 13  Temp:    SpO2: 95% 97%    Last Pain:  Vitals:   08/31/23 0957  TempSrc:   PainSc: 0-No pain                 Debby Mines

## 2023-08-31 NOTE — Op Note (Signed)
 OPERATIVE NOTE  Betty Monroe [id number] 08/31/2023   PREOPERATIVE DIAGNOSIS:  Nuclear sclerotic cataract right eye.  H25.11   POSTOPERATIVE DIAGNOSIS:    Nuclear sclerotic cataract right eye.     PROCEDURE:  Phacoemusification with posterior chamber intraocular lens placement of the right eye   LENS:   Implant Name Type Inv. Item Serial No. Manufacturer Lot No. LRB No. Used Action  LENS IOL TECNIS EYHANCE 19.0 - [id number] Intraocular Lens LENS IOL TECNIS EYHANCE 19.0 [id number] [employer]  Right 1 Implanted       Procedure(s) with comments: CATARACT EXTRACTION PHACO AND INTRAOCULAR LENS PLACEMENT (IOC) RIGHT DIABETIC (Right) - 5.00 0:31.9  SURGEON:  Adine Novak, MD, MPH  ANESTHESIOLOGIST: Anesthesiologist: Leavy Ned, MD CRNA: Nada Corean CROME, CRNA   ANESTHESIA:  Topical with tetracaine  drops augmented with 1% preservative-free intracameral lidocaine .  ESTIMATED BLOOD LOSS: less than 1 mL.   COMPLICATIONS:  None.   DESCRIPTION OF PROCEDURE:  The patient was identified in the holding room and transported to the operating room and placed in the supine position under the operating microscope.  The right eye was identified as the operative eye and it was prepped and draped in the usual sterile ophthalmic fashion.   A 1.0 millimeter clear-corneal paracentesis was made at the 10:30 position. 0.5 ml of preservative-free 1% lidocaine  with epinephrine  was injected into the anterior chamber.  The anterior chamber was filled with viscoelastic.  A 2.4 millimeter keratome was used to make a near-clear corneal incision at the 8:00 position.  A curvilinear capsulorrhexis was made with a cystotome and capsulorrhexis forceps.  Balanced salt  solution was used to hydrodissect and hydrodelineate the nucleus.   Phacoemulsification was then used in stop and chop fashion to remove the lens nucleus and epinucleus.  The remaining cortex was then removed using the irrigation and aspiration  handpiece. Viscoelastic was then placed into the capsular bag to distend it for lens placement.  A lens was then injected into the capsular bag.  The remaining viscoelastic was aspirated.   Wounds were hydrated with balanced salt  solution.  The anterior chamber was inflated to a physiologic pressure with balanced salt  solution.   Intracameral vigamox  0.1 mL undiluted was injected into the eye and a drop placed onto the ocular surface.  No wound leaks were noted.  The patient was taken to the recovery room in stable condition without complications of anesthesia or surgery  Adine Novak 08/31/2023, 9:51 AM

## 2023-08-31 NOTE — H&P (Signed)
 [facility]   Primary Care Physician:  Diedra Lame, MD Ophthalmologist: Dr. Adine Novak  Pre-Procedure History & Physical: HPI:  Betty Monroe is a 78 y.o. female here for cataract surgery.   Past Medical History:  Diagnosis Date   Arthritis    Breast cancer (HCC)    Cancer (HCC) 12/1992   breast   Chickenpox    GERD (gastroesophageal reflux disease)    History of angioedema    History of colonic polyps    adenomatous   History of kidney stones    Hypertension    Lung cancer (HCC) 2005, 2015   Lung cancer (HCC)    Lymphadenopathy    Microscopic hematuria    Multiple lipomas    Shingles (herpes zoster) polyneuropathy    Sleep apnea    Thyroid  disease     Past Surgical History:  Procedure Laterality Date   BRONCHOSCOPY  02/16/2014   BUNIONECTOMY     x2   CATARACT EXTRACTION W/PHACO Left 06/15/2023   Procedure: CATARACT EXTRACTION PHACO AND INTRAOCULAR LENS PLACEMENT (IOC) LEFT DIABETIC;  Surgeon: Novak Adine Anes, MD;  Location: [facility];  Service: Ophthalmology;  Laterality: Left;  5.40 0:42.6   COLONOSCOPY     COLONOSCOPY WITH PROPOFOL  N/A 08/25/2018   Procedure: COLONOSCOPY WITH PROPOFOL ;  Surgeon: Toledo, Ladell POUR, MD;  Location: [facility] ENDOSCOPY;  Service: Gastroenterology;  Laterality: N/A;   ESOPHAGOGASTRODUODENOSCOPY     ESOPHAGOGASTRODUODENOSCOPY (EGD) WITH PROPOFOL  N/A 08/25/2018   Procedure: ESOPHAGOGASTRODUODENOSCOPY (EGD) WITH PROPOFOL ;  Surgeon: Toledo, Ladell POUR, MD;  Location: [facility] ENDOSCOPY;  Service: Gastroenterology;  Laterality: N/A;   FRACTURE SURGERY     Closed reduction ankle fracture    LYMPHADENECTOMY     MASTECTOMY Left    THORACOSCOPY Left    with lobectomy lung    TONSILLECTOMY     uvular extraction      Prior to Admission medications   Medication Sig Start Date End Date Taking? Authorizing Provider  amoxicillin (AMOXIL) 500 MG capsule Take 500 mg by mouth 4 (four) times daily. 10/24/21  Yes [provider]  citalopram (CELEXA) 10 MG tablet Take 10 mg by mouth daily. 09/02/21  Yes [provider]  fluticasone (FLONASE) 50 MCG/ACT nasal spray Place 2 sprays into both nostrils daily. 09/04/20  Yes [provider]  glipiZIDE (GLUCOTROL XL) 5 MG 24 hr tablet Take 5 mg by mouth daily with breakfast.   Yes [provider]  levothyroxine (SYNTHROID) 112 MCG tablet Take 112 mcg by mouth daily before breakfast.   Yes [provider]  losartan-hydrochlorothiazide (HYZAAR) 100-25 MG tablet Take 1 tablet by mouth at bedtime.  11/17/19  Yes [provider]  metFORMIN (GLUCOPHAGE-XR) 500 MG 24 hr tablet Take 500 mg by mouth 2 (two) times daily with a meal. 11/17/19  Yes [provider]  rosuvastatin (CRESTOR) 5 MG tablet Take 5 mg by mouth daily. 11/19/21  Yes [provider]  VITAMIN D PO Take by mouth.   Yes [provider]  vitamin E 400 UNIT capsule Take 400 Units by mouth daily.   Yes [provider]  benzonatate  (TESSALON ) 100 MG capsule Take 1 capsule (100 mg total) by mouth 3 (three) times daily as needed for cough. Patient not taking: Reported on 06/04/2023 11/23/21   Corlis Burnard DEL, NP  meloxicam  (MOBIC ) 15 MG tablet Take 1 tablet (15 mg total) by mouth daily. Patient not taking: Reported on 06/04/2023 12/26/19   Verta Royden DASEN, DPM  triamcinolone  cream (KENALOG ) 0.1 % Apply topically 2 (two) times daily. Patient not taking: Reported on 06/04/2023 09/25/20   [provider]    Allergies as of 06/01/2023 - Review Complete 11/23/2021  Allergen Reaction Noted   Lisinopril Swelling 09/27/2017   Meloxicam  Other (See Comments) 02/08/2020   Latex Rash 09/27/2017   Other Rash 01/31/2014    Family History  Problem Relation Age of Onset   Heart failure Mother    Diabetes Mother    Alzheimer's disease Father    Heart failure Father    Uterine cancer Sister    Colon polyps Sister    Breast cancer Sister    Colon cancer  Maternal Uncle     Social History   Socioeconomic History   Marital status: Married    Spouse name: Not on file   Number of children: Not on file   Years of education: Not on file   Highest education level: Not on file  Occupational History   Not on file  Tobacco Use   Smoking status: Never   Smokeless tobacco: Never  Vaping Use   Vaping status: Never Used  Substance and Sexual Activity   Alcohol use: Yes    Comment: rarely   Drug use: Never   Sexual activity: Yes  Other Topics Concern   Not on file  Social History Narrative   Not on file   Social Drivers of Health   Financial Resource Strain: Low Risk  (08/26/2023)   Received from [facility]   Overall Financial Resource Strain (CARDIA)    Difficulty of Paying Living Expenses: Not hard at all  Food Insecurity: No Food Insecurity (08/26/2023)   Received from [facility]   Hunger Vital Sign    Worried About Running Out of Food in the Last Year: Never true    Ran Out of Food in the Last Year: Never true  Transportation Needs: No Transportation Needs (08/26/2023)   Received from [facility] - [facility]    In the past 12 months, has lack of transportation kept you from medical appointments or from getting medications?: No    Lack of Transportation (Non-Medical): No  Physical Activity: Not on file  Stress: Not on file  Social Connections: Not on file  Intimate Partner Violence: Not on file    Review of Systems: See HPI, otherwise negative ROS  Physical Exam: BP (!) 152/72   Pulse 63   Temp (!) 97.2 F (36.2 C) (Temporal)   Resp 14   Ht 5' 5 (1.651 m)   Wt 95.4 kg   SpO2 95%   BMI 35.01 kg/m  General:   Alert, cooperative in NAD Head:  Normocephalic and atraumatic. Respiratory:  Normal work of breathing. Cardiovascular:  RRR  Impression/Plan: Betty Monroe is here for cataract surgery.  Risks, benefits, limitations, and alternatives  regarding cataract surgery have been reviewed with the patient.  Questions have been answered.  All parties agreeable.   Adine Novak, MD  08/31/2023, 9:20 AM

## 2023-09-01 ENCOUNTER — Encounter: Payer: Self-pay | Admitting: Ophthalmology

## 2024-01-07 ENCOUNTER — Encounter: Payer: Self-pay | Admitting: Internal Medicine

## 2024-01-27 ENCOUNTER — Encounter: Payer: Self-pay | Admitting: Internal Medicine

## 2024-02-03 ENCOUNTER — Ambulatory Visit

## 2024-02-03 ENCOUNTER — Ambulatory Visit
Admission: RE | Admit: 2024-02-03 | Discharge: 2024-02-03 | Disposition: A | Discharge status: Home / Self Care | Source: Ambulatory Visit | Attending: Internal Medicine | Admitting: Internal Medicine

## 2024-02-03 ENCOUNTER — Other Ambulatory Visit: Payer: Self-pay

## 2024-02-03 ENCOUNTER — Encounter
Admission: RE | Disposition: A | Discharge status: Home / Self Care | Payer: Self-pay | Source: Ambulatory Visit | Attending: Internal Medicine

## 2024-02-03 ENCOUNTER — Encounter: Payer: Self-pay | Admitting: Internal Medicine

## 2024-02-03 DIAGNOSIS — G4730 Sleep apnea, unspecified: Secondary | ICD-10-CM | POA: Insufficient documentation

## 2024-02-03 DIAGNOSIS — Z1211 Encounter for screening for malignant neoplasm of colon: Secondary | ICD-10-CM | POA: Insufficient documentation

## 2024-02-03 DIAGNOSIS — I10 Essential (primary) hypertension: Secondary | ICD-10-CM | POA: Insufficient documentation

## 2024-02-03 DIAGNOSIS — K5730 Diverticulosis of large intestine without perforation or abscess without bleeding: Secondary | ICD-10-CM | POA: Insufficient documentation

## 2024-02-03 DIAGNOSIS — K635 Polyp of colon: Secondary | ICD-10-CM | POA: Diagnosis not present

## 2024-02-03 DIAGNOSIS — E119 Type 2 diabetes mellitus without complications: Secondary | ICD-10-CM | POA: Insufficient documentation

## 2024-02-03 DIAGNOSIS — K219 Gastro-esophageal reflux disease without esophagitis: Secondary | ICD-10-CM | POA: Diagnosis not present

## 2024-02-03 HISTORY — DX: Type 2 diabetes mellitus without complications: E11.9

## 2024-02-03 HISTORY — PX: COLONOSCOPY: SHX5424

## 2024-02-03 HISTORY — DX: Hypothyroidism, unspecified: E03.9

## 2024-02-03 HISTORY — PX: POLYPECTOMY: SHX149

## 2024-02-03 HISTORY — DX: Prediabetes: R73.03

## 2024-02-03 HISTORY — DX: Pure hypercholesterolemia, unspecified: E78.00

## 2024-02-03 LAB — GLUCOSE, CAPILLARY: Glucose-Capillary: 161 mg/dL — ABNORMAL HIGH (ref 70–99)

## 2024-02-03 SURGERY — COLONOSCOPY
Anesthesia: General

## 2024-02-03 MED ORDER — SODIUM CHLORIDE 0.9 % IV SOLN: INTRAVENOUS | Status: DC | PRN

## 2024-02-03 MED ORDER — SODIUM CHLORIDE 0.9 % IV SOLN
INTRAVENOUS | Status: DC
Start: 2024-02-03 — End: 2024-02-03
  Administered 2024-02-03: 500 mL via INTRAVENOUS

## 2024-02-03 MED ORDER — PROPOFOL 10 MG/ML IV BOLUS
INTRAVENOUS | Status: DC | PRN
  Administered 2024-02-03 (×2): 50 mg via INTRAVENOUS
  Administered 2024-02-03: 100 mg via INTRAVENOUS
  Administered 2024-02-03 (×3): 50 mg via INTRAVENOUS

## 2024-02-03 NOTE — Op Note (Signed)
 [facility] Gastroenterology Patient Name: Betty Monroe Procedure Date: 02/03/2024 10:33 AM MRN: [id number] Account #: [id number] Date of Birth: 14-Sep-1945 Admit Type: Outpatient Age: 78 Room: [facility] ENDO ROOM 3 Gender: Female Note Status: Finalized Instrument Name: Charlyn Cooley [id number] Procedure:             Colonoscopy Indications:           High risk colon cancer surveillance: Personal history                         of non-advanced adenoma, High risk colon cancer                         surveillance: Personal history of adenoma less than 10                         mm in size Providers:             Manasi Dishon K. Corky Diener MD, MD Referring MD:          Rhesa Celeste MD (Referring MD) Medicines:             Propofol  per Anesthesia Complications:         No immediate complications. Estimated blood loss: None. Procedure:             Pre-Anesthesia Assessment:                        - The risks and benefits of the procedure and the                         sedation options and risks were discussed with the                         patient. All questions were answered and informed                         consent was obtained.                        - Patient identification and proposed procedure were                         verified prior to the procedure by the nurse. The                         procedure was verified in the procedure room.                        - ASA Grade Assessment: III - A patient with severe                         systemic disease.                        - After reviewing the risks and benefits, the patient                         was deemed in satisfactory condition to undergo the  procedure.                        After obtaining informed consent, the colonoscope was                         passed under direct vision. Throughout the procedure,                         the patient's blood pressure, pulse, and oxygen                          saturations were monitored continuously. The                         Colonoscope was introduced through the anus and                         advanced to the the cecum, identified by appendiceal                         orifice and ileocecal valve. The colonoscopy was                         performed without difficulty. The patient tolerated                         the procedure well. The quality of the bowel                         preparation was good. The ileocecal valve, appendiceal                         orifice, and rectum were photographed. Findings:      The perianal and digital rectal examinations were normal. Pertinent       negatives include normal sphincter tone and no palpable rectal lesions.      A few small-mouthed diverticula were found in the sigmoid colon.      An 8 mm polyp was found in the sigmoid colon. The polyp was sessile. The       polyp was removed with a hot snare. Resection and retrieval were       complete. Estimated blood loss: none.      The exam was otherwise without abnormality on direct and retroflexion       views. Impression:            - Diverticulosis in the sigmoid colon.                        - One 8 mm polyp in the sigmoid colon, removed with a                         hot snare. Resected and retrieved.                        - The examination was otherwise normal on direct and                         retroflexion views. Recommendation:        -  Patient has a contact number available for                         emergencies. The signs and symptoms of potential                         delayed complications were discussed with the patient.                         Return to normal activities tomorrow. Written                         discharge instructions were provided to the patient.                        - Resume previous diet.                        - Continue present medications.                        - Repeat colonoscopy is  recommended for surveillance.                         The colonoscopy date will be determined after                         pathology results from today's exam become available                         for review.                        - Return to GI office PRN.                        - The findings and recommendations were discussed with                         the patient. Procedure Code(s):     --- Professional ---                        [phone], Colonoscopy, flexible; with removal of                         tumor(s), polyp(s), or other lesion(s) by snare                         technique Diagnosis Code(s):     --- Professional ---                        K57.30, Diverticulosis of large intestine without                         perforation or abscess without bleeding                        Z86.010, Personal history of colonic polyps  D12.5, Benign neoplasm of sigmoid colon CPT copyright 2022 American [employer] Association. All rights reserved. The codes documented in this report are preliminary and upon coder review may  be revised to meet current compliance requirements. Cassie Click MD, MD 02/03/2024 10:57:59 AM This report has been signed electronically. Number of Addenda: 0 Note Initiated On: 02/03/2024 10:33 AM Scope Withdrawal Time: 0 hours 8 minutes 23 seconds  Total Procedure Duration: 0 hours 14 minutes 46 seconds  Estimated Blood Loss:  Estimated blood loss: none.      [facility]

## 2024-02-03 NOTE — Transfer of Care (Signed)
 Immediate Anesthesia Transfer of Care Note  Patient: Betty Monroe  Procedure(s) Performed: COLONOSCOPY POLYPECTOMY, INTESTINE  Patient Location: PACU and Endoscopy Unit  Anesthesia Type:MAC  Level of Consciousness: drowsy  Airway & Oxygen Therapy: Patient Spontanous Breathing  Post-op Assessment: Report given to RN, Post -op Vital signs reviewed and stable, Patient moving all extremities, and Patient moving all extremities X 4  Post vital signs: Reviewed and stable  Last Vitals:  Vitals Value Taken Time  BP 90/59 02/03/24 11:00  Temp    Pulse 59 02/03/24 11:01  Resp 13 02/03/24 11:01  SpO2 98 % 02/03/24 11:01  Vitals shown include unfiled device data.  Last Pain:  Vitals:   02/03/24 1004  TempSrc: Temporal  PainSc: 0-No pain         Complications: No notable events documented.

## 2024-02-03 NOTE — H&P (Signed)
 Outpatient short stay form Pre-procedure 02/03/2024 10:22 AM Gaylyn Berish K. Corky Diener, M.D.  Primary Physician: Nestor Banter, M.D.  Reason for visit:  History of adenomatous colon polyps  History of present illness:  Ms. Agro presents to the [facility] GI clinic at the request of her PCP for chief complaint of high-risk colon cancer surveillance 2/2 personal hx of adenomatous colon polyps. Last colonoscopy Jan 2020 performed by Dr. Corky Diener removed four subcentimeter benign polyps from left colon. A 5-year repeat was advised. She has hx of adenomatous polyp removed on previous colonoscopies. She wants to stay up to date on cancer screening due to her history of lung cancer, endometrial cancer, and breast cancer previously. She follows annually with Oncology with a Dr. Tyrone Gallop in [place], [place]. She has noticed over the past 1-year a change in her bowel habits where her bowels are more thinner than she is used to. She has also noticed issues with rectal leakage of stool/incontinence couple times a month. No clear pattern with incontinence. She denies any issues with abdominal pain, abdominal cramping, hematochezia, melena, or steatorrhea. No unintentional weight loss. She has occasional GERD symptoms during the week depending on what she eats or how much she eats. She is exclusively using TUMs as needed for symptom control. She is no longer needing PPI for symptomatic relief. She denies any concerning UGI symptoms such as nausea, vomiting, esophageal dysphagia, odynophagia, early satiety, hoarseness, or epigastric abdominal pain. No other questions or concerns at this time.     Current Facility-Administered Medications:    0.9 %  sodium chloride  infusion, , Intravenous, Continuous, [place], Jerri Hargadon K, MD, Last Rate: 20 mL/hr at 02/03/24 1011, 500 mL at 02/03/24 1011  Medications Prior to Admission  Medication Sig Dispense Refill Last Dose/Taking   fluticasone (FLONASE) 50 MCG/ACT nasal spray Place 2 sprays into  both nostrils daily.   Past Week   glipiZIDE (GLUCOTROL XL) 5 MG 24 hr tablet Take 5 mg by mouth daily with breakfast.   02/01/2024   levothyroxine (SYNTHROID) 112 MCG tablet Take 112 mcg by mouth daily before breakfast.   02/02/2024   losartan-hydrochlorothiazide (HYZAAR) 100-25 MG tablet Take 1 tablet by mouth at bedtime.    02/02/2024   rosuvastatin (CRESTOR) 5 MG tablet Take 5 mg by mouth daily.   02/02/2024   VITAMIN D PO Take by mouth.   02/02/2024   vitamin E 400 UNIT capsule Take 400 Units by mouth daily.   02/02/2024   amoxicillin (AMOXIL) 500 MG capsule Take 500 mg by mouth 4 (four) times daily. (Patient not taking: Reported on 02/03/2024)   Completed Course   benzonatate  (TESSALON ) 100 MG capsule Take 1 capsule (100 mg total) by mouth 3 (three) times daily as needed for cough. (Patient not taking: Reported on 06/04/2023) 21 capsule 0    citalopram (CELEXA) 10 MG tablet Take 10 mg by mouth daily.   02/01/2024   meloxicam  (MOBIC ) 15 MG tablet Take 1 tablet (15 mg total) by mouth daily. (Patient not taking: Reported on 06/04/2023) 30 tablet 3    metFORMIN (GLUCOPHAGE-XR) 500 MG 24 hr tablet Take 500 mg by mouth 2 (two) times daily with a meal.   02/01/2024   triamcinolone  cream (KENALOG ) 0.1 % Apply topically 2 (two) times daily. (Patient not taking: Reported on 06/04/2023)        Allergies  Allergen Reactions   Lisinopril Swelling   Latex Rash    Adhesive tape- silicones   Other Rash     Past Medical  History:  Diagnosis Date   Arthritis    Breast cancer (HCC)    Cancer (HCC) 12/1992   breast   Chickenpox    Diabetes mellitus without complication (HCC)    GERD (gastroesophageal reflux disease)    History of angioedema    History of colonic polyps    adenomatous   History of kidney stones    Hypertension    Hypothyroidism    Lung cancer (HCC) 2005, 2015   Lung cancer (HCC)    Lymphadenopathy    Microscopic hematuria    Multiple lipomas    Pre-diabetes    Pure  hypercholesterolemia    Shingles (herpes zoster) polyneuropathy    Sleep apnea    Thyroid  disease     Review of systems:  Otherwise negative.    Physical Exam  Gen: Alert, oriented. Appears stated age.  HEENT: [place]/AT. PERRLA. Lungs: CTA, no wheezes. CV: RR nl S1, S2. Abd: soft, benign, no masses. BS+ Ext: No edema. Pulses 2+    Planned procedures: Proceed with colonoscopy. The patient understands the nature of the planned procedure, indications, risks, alternatives and potential complications including but not limited to bleeding, infection, perforation, damage to internal organs and possible oversedation/side effects from anesthesia. The patient agrees and gives consent to proceed.  Please refer to procedure notes for findings, recommendations and patient disposition/instructions.     Tari Lecount K. Corky Diener, M.D. Gastroenterology 02/03/2024  10:22 AM

## 2024-02-03 NOTE — Interval H&P Note (Signed)
 History and Physical Interval Note:  02/03/2024 10:25 AM  Betty Monroe  has presented today for surgery, with the diagnosis of Hx of adenomatous colonic polyps (Z86.0101) Change in bowel habits (R19.4).  The various methods of treatment have been discussed with the patient and family. After consideration of risks, benefits and other options for treatment, the patient has consented to  Procedure(s) with comments: COLONOSCOPY (N/A) - DM as a surgical intervention.  The patient's history has been reviewed, patient examined, no change in status, stable for surgery.  I have reviewed the patient's chart and labs.  Questions were answered to the patient's satisfaction.     [place], Betty Monroe

## 2024-02-03 NOTE — Anesthesia Postprocedure Evaluation (Signed)
 Anesthesia Post Note  Patient: Camelle Henkels  Procedure(s) Performed: COLONOSCOPY POLYPECTOMY, INTESTINE  Patient location during evaluation: Endoscopy Anesthesia Type: General Level of consciousness: awake and alert Pain management: pain level controlled Vital Signs Assessment: post-procedure vital signs reviewed and stable Respiratory status: spontaneous breathing, nonlabored ventilation, respiratory function stable and patient connected to nasal cannula oxygen Cardiovascular status: blood pressure returned to baseline and stable Postop Assessment: no apparent nausea or vomiting Anesthetic complications: no   No notable events documented.   Last Vitals:  Vitals:   02/03/24 1108 02/03/24 1118  BP: 134/60 134/62  Pulse: 65 (!) 58  Resp: (!) 21 18  Temp:  (!) 36.3 C  SpO2: 98% 99%    Last Pain:  Vitals:   02/03/24 1118  TempSrc: Temporal  PainSc: 0-No pain                 Nancey Awkward

## 2024-02-03 NOTE — Anesthesia Preprocedure Evaluation (Signed)
 Anesthesia Evaluation  Patient identified by MRN, date of birth, ID band Patient awake    Reviewed: Allergy & Precautions, H&P , NPO status , Patient's Chart, lab work & pertinent test results  Airway Mallampati: IV  TM Distance: <3 FB Neck ROM: Full    Dental no notable dental hx. (+) Caps   Pulmonary sleep apnea    Pulmonary exam normal breath sounds clear to auscultation       Cardiovascular hypertension, Normal cardiovascular exam Rhythm:Regular Rate:Normal     Neuro/Psych  Neuromuscular disease  negative psych ROS   GI/Hepatic Neg liver ROS,GERD  ,,  Endo/Other  diabetesHypothyroidism    Renal/GU negative Renal ROS  negative genitourinary   Musculoskeletal  (+) Arthritis ,    Abdominal   Peds negative pediatric ROS (+)  Hematology negative hematology ROS (+)   Anesthesia Other Findings Previous cataract surgery 06-15-23 with Dr. Aldo Amble anesthesiologist, Alvira Josephs CRNA, previous meds versed  1 mg IV, fentanyl  50 mcg IV  Arthritis  Cancer  GERD (gastroesophageal reflux disease) History of angioedema  History of colonic polyps History of kidney stones  Hypertension Lung cancer (HCC)  Lymphadenopathy Microscopic hematuria  Multiple lipomas Sleep apnea  Thyroid  disease Shingles (herpes zoster) polyneuropathy Breast cancer (HCC) Lung cancer (HCC)     Reproductive/Obstetrics negative OB ROS                              Anesthesia Physical Anesthesia Plan  ASA: 3  Anesthesia Plan: General   Post-op Pain Management: Minimal or no pain anticipated   Induction: Intravenous  PONV Risk Score and Plan: 2 and Propofol  infusion and TIVA  Airway Management Planned: Natural Airway and Nasal Cannula  Additional Equipment:   Intra-op Plan:   Post-operative Plan:   Informed Consent: I have reviewed the patients History and Physical, chart, labs and discussed the procedure  including the risks, benefits and alternatives for the proposed anesthesia with the patient or authorized representative who has indicated his/her understanding and acceptance.     Dental Advisory Given  Plan Discussed with: Anesthesiologist, CRNA and Surgeon  Anesthesia Plan Comments: (Patient consented for risks of anesthesia including but not limited to:  - adverse reactions to medications - damage to eyes, teeth, lips or other oral mucosa - nerve damage due to positioning  - sore throat or hoarseness - Damage to heart, brain, nerves, lungs, other parts of body or loss of life  Patient voiced understanding and assent.)        Anesthesia Quick Evaluation

## 2024-02-04 LAB — SURGICAL PATHOLOGY

## 2024-02-15 ENCOUNTER — Encounter: Payer: Self-pay | Admitting: Internal Medicine
